# Patient Record
Sex: Female | Born: 1950 | Race: Black or African American | Hispanic: No | Smoking: Former smoker
Health system: Southern US, Community
[De-identification: ages and names within clinical notes are randomized; demographics above are authoritative.]

## PROBLEM LIST (undated history)

## (undated) DIAGNOSIS — M1990 Unspecified osteoarthritis, unspecified site: Secondary | ICD-10-CM

## (undated) DIAGNOSIS — I82409 Acute embolism and thrombosis of unspecified deep veins of unspecified lower extremity: Secondary | ICD-10-CM

## (undated) DIAGNOSIS — E876 Hypokalemia: Secondary | ICD-10-CM

## (undated) DIAGNOSIS — D649 Anemia, unspecified: Secondary | ICD-10-CM

## (undated) HISTORY — DX: Acute embolism and thrombosis of unspecified deep veins of unspecified lower extremity: I82.409

## (undated) HISTORY — PX: NO PAST SURGERIES: SHX2092

## (undated) HISTORY — DX: Hypokalemia: E87.6

## (undated) HISTORY — DX: Anemia, unspecified: D64.9

---

## 2011-06-19 DIAGNOSIS — I82409 Acute embolism and thrombosis of unspecified deep veins of unspecified lower extremity: Secondary | ICD-10-CM

## 2011-06-19 HISTORY — DX: Acute embolism and thrombosis of unspecified deep veins of unspecified lower extremity: I82.409

## 2011-07-15 ENCOUNTER — Emergency Department (HOSPITAL_COMMUNITY)
Admission: EM | Admit: 2011-07-15 | Discharge: 2011-07-15 | Disposition: A | Discharge status: Home / Self Care | Payer: Medicaid Other | Attending: Emergency Medicine | Admitting: Emergency Medicine

## 2011-07-15 ENCOUNTER — Emergency Department (HOSPITAL_COMMUNITY): Payer: Medicaid Other

## 2011-07-15 DIAGNOSIS — N390 Urinary tract infection, site not specified: Secondary | ICD-10-CM | POA: Insufficient documentation

## 2011-07-15 DIAGNOSIS — R109 Unspecified abdominal pain: Secondary | ICD-10-CM | POA: Insufficient documentation

## 2011-07-15 DIAGNOSIS — N898 Other specified noninflammatory disorders of vagina: Secondary | ICD-10-CM | POA: Insufficient documentation

## 2011-07-15 DIAGNOSIS — N949 Unspecified condition associated with female genital organs and menstrual cycle: Secondary | ICD-10-CM | POA: Insufficient documentation

## 2011-07-15 LAB — CBC
HCT: 30.8 % — ABNORMAL LOW (ref 36.0–46.0)
Hemoglobin: 9.1 g/dL — ABNORMAL LOW (ref 12.0–15.0)
MCH: 22.9 pg — ABNORMAL LOW (ref 26.0–34.0)
MCHC: 29.5 g/dL — ABNORMAL LOW (ref 30.0–36.0)
MCV: 77.4 fL — ABNORMAL LOW (ref 78.0–100.0)
Platelets: 179 10*3/uL (ref 150–400)
RBC: 3.98 MIL/uL (ref 3.87–5.11)
RDW: 17.8 % — ABNORMAL HIGH (ref 11.5–15.5)
WBC: 6.7 10*3/uL (ref 4.0–10.5)

## 2011-07-15 LAB — DIFFERENTIAL
Basophils Absolute: 0 10*3/uL (ref 0.0–0.1)
Basophils Relative: 0 % (ref 0–1)
Eosinophils Absolute: 0.2 10*3/uL (ref 0.0–0.7)
Eosinophils Relative: 2 % (ref 0–5)
Lymphocytes Relative: 32 % (ref 12–46)
Lymphs Abs: 2.1 10*3/uL (ref 0.7–4.0)
Monocytes Absolute: 0.5 10*3/uL (ref 0.1–1.0)
Monocytes Relative: 7 % (ref 3–12)
Neutro Abs: 4 10*3/uL (ref 1.7–7.7)
Neutrophils Relative %: 59 % (ref 43–77)

## 2011-07-15 LAB — BASIC METABOLIC PANEL
BUN: 13 mg/dL (ref 6–23)
CO2: 26 mEq/L (ref 19–32)
Calcium: 8.6 mg/dL (ref 8.4–10.5)
Chloride: 107 mEq/L (ref 96–112)
Creatinine, Ser: 0.71 mg/dL (ref 0.50–1.10)
GFR calc Af Amer: >60 mL/min (ref >60)
GFR calc non Af Amer: >60 mL/min (ref >60)
Glucose, Bld: 82 mg/dL (ref 70–99)
Potassium: 2.9 mEq/L — ABNORMAL LOW (ref 3.5–5.1)
Sodium: 142 mEq/L (ref 135–145)

## 2011-07-15 LAB — URINALYSIS, ROUTINE W REFLEX MICROSCOPIC
Bilirubin Urine: NEGATIVE
Glucose, UA: NEGATIVE mg/dL
Ketones, ur: 15 mg/dL — AB
Nitrite: NEGATIVE
Protein, ur: NEGATIVE mg/dL
Specific Gravity, Urine: 1.015 (ref 1.005–1.030)
Urobilinogen, UA: 0.2 mg/dL (ref 0.0–1.0)
pH: 6 (ref 5.0–8.0)

## 2011-07-15 LAB — URINE MICROSCOPIC-ADD ON

## 2011-07-30 ENCOUNTER — Inpatient Hospital Stay (HOSPITAL_COMMUNITY)
Admission: EM | Admit: 2011-07-30 | Discharge: 2011-08-06 | DRG: 299 | Disposition: A | Discharge status: Home Health | Payer: Medicaid Other | Attending: Internal Medicine | Admitting: Internal Medicine

## 2011-07-30 ENCOUNTER — Inpatient Hospital Stay (HOSPITAL_COMMUNITY)
Admission: AD | Admit: 2011-07-30 | Discharge: 2011-07-30 | Disposition: A | Discharge status: Still In Hospital | Payer: Medicaid Other | Source: Ambulatory Visit | Attending: Obstetrics and Gynecology | Admitting: Obstetrics and Gynecology

## 2011-07-30 ENCOUNTER — Encounter (HOSPITAL_COMMUNITY): Payer: Self-pay

## 2011-07-30 DIAGNOSIS — N938 Other specified abnormal uterine and vaginal bleeding: Secondary | ICD-10-CM | POA: Diagnosis present

## 2011-07-30 DIAGNOSIS — N8500 Endometrial hyperplasia, unspecified: Secondary | ICD-10-CM | POA: Diagnosis present

## 2011-07-30 DIAGNOSIS — A5901 Trichomonal vulvovaginitis: Secondary | ICD-10-CM | POA: Insufficient documentation

## 2011-07-30 DIAGNOSIS — A599 Trichomoniasis, unspecified: Secondary | ICD-10-CM

## 2011-07-30 DIAGNOSIS — N950 Postmenopausal bleeding: Secondary | ICD-10-CM | POA: Insufficient documentation

## 2011-07-30 DIAGNOSIS — I824Y9 Acute embolism and thrombosis of unspecified deep veins of unspecified proximal lower extremity: Principal | ICD-10-CM | POA: Diagnosis present

## 2011-07-30 DIAGNOSIS — E669 Obesity, unspecified: Secondary | ICD-10-CM | POA: Diagnosis present

## 2011-07-30 DIAGNOSIS — I82409 Acute embolism and thrombosis of unspecified deep veins of unspecified lower extremity: Secondary | ICD-10-CM | POA: Insufficient documentation

## 2011-07-30 DIAGNOSIS — D500 Iron deficiency anemia secondary to blood loss (chronic): Secondary | ICD-10-CM | POA: Diagnosis present

## 2011-07-30 DIAGNOSIS — I2699 Other pulmonary embolism without acute cor pulmonale: Secondary | ICD-10-CM | POA: Diagnosis present

## 2011-07-30 DIAGNOSIS — N949 Unspecified condition associated with female genital organs and menstrual cycle: Secondary | ICD-10-CM | POA: Diagnosis present

## 2011-07-30 LAB — URINALYSIS, ROUTINE W REFLEX MICROSCOPIC
Ketones, ur: 15 mg/dL — AB
Protein, ur: 30 mg/dL — AB
Urobilinogen, UA: 0.2 mg/dL (ref 0.0–1.0)

## 2011-07-30 LAB — COMPREHENSIVE METABOLIC PANEL
ALT: 6 U/L (ref 0–35)
AST: 16 U/L (ref 0–37)
CO2: 25 mEq/L (ref 19–32)
Chloride: 102 mEq/L (ref 96–112)
GFR calc non Af Amer: 54 mL/min — ABNORMAL LOW (ref >60)
Potassium: 4.3 mEq/L (ref 3.5–5.1)
Sodium: 138 mEq/L (ref 135–145)
Total Bilirubin: 0.2 mg/dL — ABNORMAL LOW (ref 0.3–1.2)

## 2011-07-30 LAB — DIFFERENTIAL
Basophils Absolute: 0 10*3/uL (ref 0.0–0.1)
Lymphocytes Relative: 21 % (ref 12–46)
Neutro Abs: 5.8 10*3/uL (ref 1.7–7.7)

## 2011-07-30 LAB — URINE MICROSCOPIC-ADD ON

## 2011-07-30 LAB — WET PREP, GENITAL

## 2011-07-30 LAB — CBC
HCT: 30.3 % — ABNORMAL LOW (ref 36.0–46.0)
Platelets: 188 10*3/uL (ref 150–400)
RDW: 18 % — ABNORMAL HIGH (ref 11.5–15.5)
WBC: 8.2 10*3/uL (ref 4.0–10.5)

## 2011-07-30 MED ORDER — METRONIDAZOLE 500 MG PO TABS
2000.0000 mg | ORAL_TABLET | Freq: Once | ORAL | Status: AC
  Administered 2011-07-30: 2000 mg via ORAL
  Filled 2011-07-30: qty 4

## 2011-07-30 MED ORDER — OXYCODONE-ACETAMINOPHEN 5-325 MG PO TABS
2.0000 | ORAL_TABLET | Freq: Once | ORAL | Status: AC
  Administered 2011-07-30: 2 via ORAL
  Filled 2011-07-30: qty 2

## 2011-07-30 NOTE — ED Provider Notes (Signed)
This is an addendum to the previously documented note by Dr. Lucina Mellow on 07/30/2011.   Procedures: - Wet Prep - [place] b/l lower extremities  Assessment and Plan 1)LLE DVT - Preliminary report states DVT noted in popliteal and common femoral veins. Started on treatment dose lovenox. Will transfer to [facility] for further care 2)Postmenopausal Vaginal Bleeding - Recent pelvic US did not identify mass concerning for malignancy, however suspicion for this is very high given bleeding from cervix noted on exam and concurrence of DVT 3)Trichomonas - s/p 2g flagyl x1.  4)Transfer to [facility] for further management.  Lutricia Feil MD  This plan was discussed in detail with Dr. Debroah Loop who agrees with the plan.

## 2011-07-30 NOTE — Progress Notes (Signed)
Pt states she has not had regular periods for over one year. Has been having abdominal pain/cramping almost every month for the entire time. Pt states she was seen at [facility] ED about 2 weeks ago and was told she has an enlarged uterus and referred to the [facility]. Her appointment is not until October and the pain will not go away. Legs swelling.

## 2011-07-31 ENCOUNTER — Emergency Department (HOSPITAL_COMMUNITY): Payer: Medicaid Other

## 2011-07-31 ENCOUNTER — Encounter (HOSPITAL_COMMUNITY): Payer: Self-pay

## 2011-07-31 LAB — DIFFERENTIAL
Eosinophils Relative: 2 % (ref 0–5)
Lymphocytes Relative: 16 % (ref 12–46)
Lymphs Abs: 1.1 10*3/uL (ref 0.7–4.0)
Monocytes Absolute: 0.3 10*3/uL (ref 0.1–1.0)
Monocytes Relative: 4 % (ref 3–12)
Neutro Abs: 5.4 10*3/uL (ref 1.7–7.7)

## 2011-07-31 LAB — GC/CHLAMYDIA PROBE AMP, GENITAL: GC Probe Amp, Genital: NEGATIVE

## 2011-07-31 LAB — CBC
HCT: 28.7 % — ABNORMAL LOW (ref 36.0–46.0)
Hemoglobin: 8.3 g/dL — ABNORMAL LOW (ref 12.0–15.0)
MCHC: 28.9 g/dL — ABNORMAL LOW (ref 30.0–36.0)
MCV: 77.6 fL — ABNORMAL LOW (ref 78.0–100.0)
RDW: 17.5 % — ABNORMAL HIGH (ref 11.5–15.5)

## 2011-07-31 LAB — HEPARIN LEVEL (UNFRACTIONATED): Heparin Unfractionated: 0.56 IU/mL (ref 0.30–0.70)

## 2011-07-31 LAB — APTT: aPTT: 75 seconds — ABNORMAL HIGH (ref 24–37)

## 2011-07-31 LAB — URINE CULTURE

## 2011-07-31 LAB — PROTIME-INR: Prothrombin Time: 16.3 seconds — ABNORMAL HIGH (ref 11.6–15.2)

## 2011-07-31 MED ORDER — IOHEXOL 350 MG/ML SOLN
100.0000 mL | Freq: Once | INTRAVENOUS | Status: AC | PRN
  Administered 2011-07-31: 100 mL via INTRAVENOUS

## 2011-08-01 LAB — CBC
HCT: 27.2 % — ABNORMAL LOW (ref 36.0–46.0)
Hemoglobin: 8 g/dL — ABNORMAL LOW (ref 12.0–15.0)
MCHC: 29.4 g/dL — ABNORMAL LOW (ref 30.0–36.0)
MCV: 76.6 fL — ABNORMAL LOW (ref 78.0–100.0)
RDW: 17.5 % — ABNORMAL HIGH (ref 11.5–15.5)

## 2011-08-01 LAB — BASIC METABOLIC PANEL
BUN: 9 mg/dL (ref 6–23)
CO2: 24 mEq/L (ref 19–32)
Calcium: 8.7 mg/dL (ref 8.4–10.5)
GFR calc non Af Amer: >60 mL/min (ref >60)
Glucose, Bld: 92 mg/dL (ref 70–99)
Potassium: 3.6 mEq/L (ref 3.5–5.1)
Sodium: 138 mEq/L (ref 135–145)

## 2011-08-02 LAB — HEMOGLOBIN AND HEMATOCRIT, BLOOD: HCT: 28.4 % — ABNORMAL LOW (ref 36.0–46.0)

## 2011-08-02 LAB — CBC
HCT: 28.3 % — ABNORMAL LOW (ref 36.0–46.0)
Hemoglobin: 8 g/dL — ABNORMAL LOW (ref 12.0–15.0)
RBC: 3.62 MIL/uL — ABNORMAL LOW (ref 3.87–5.11)

## 2011-08-02 LAB — HEPARIN LEVEL (UNFRACTIONATED)
Heparin Unfractionated: 0.28 IU/mL — ABNORMAL LOW (ref 0.30–0.70)
Heparin Unfractionated: 0.29 IU/mL — ABNORMAL LOW (ref 0.30–0.70)

## 2011-08-02 LAB — PROTIME-INR
INR: 1.09 (ref 0.00–1.49)
Prothrombin Time: 14.3 seconds (ref 11.6–15.2)

## 2011-08-03 LAB — PROTIME-INR
INR: 1.29 (ref 0.00–1.49)
Prothrombin Time: 16.3 seconds — ABNORMAL HIGH (ref 11.6–15.2)

## 2011-08-03 LAB — CBC
Platelets: 298 10*3/uL (ref 150–400)
RBC: 3.6 MIL/uL — ABNORMAL LOW (ref 3.87–5.11)
RDW: 17.6 % — ABNORMAL HIGH (ref 11.5–15.5)
WBC: 6.1 10*3/uL (ref 4.0–10.5)

## 2011-08-03 LAB — HEMOGLOBIN AND HEMATOCRIT, BLOOD
HCT: 28.5 % — ABNORMAL LOW (ref 36.0–46.0)
Hemoglobin: 8.2 g/dL — ABNORMAL LOW (ref 12.0–15.0)

## 2011-08-04 LAB — CROSSMATCH
ABO/RH(D): AB POS
Unit division: 0

## 2011-08-04 LAB — CBC
HCT: 28.2 % — ABNORMAL LOW (ref 36.0–46.0)
MCH: 22.7 pg — ABNORMAL LOW (ref 26.0–34.0)
MCHC: 29.1 g/dL — ABNORMAL LOW (ref 30.0–36.0)
MCV: 78.1 fL (ref 78.0–100.0)
RDW: 17.9 % — ABNORMAL HIGH (ref 11.5–15.5)

## 2011-08-05 LAB — HEMOGLOBIN AND HEMATOCRIT, BLOOD
HCT: 28 % — ABNORMAL LOW (ref 36.0–46.0)
Hemoglobin: 8.2 g/dL — ABNORMAL LOW (ref 12.0–15.0)
Hemoglobin: 8.2 g/dL — ABNORMAL LOW (ref 12.0–15.0)

## 2011-08-05 LAB — PROTIME-INR: INR: 1.85 — ABNORMAL HIGH (ref 0.00–1.49)

## 2011-08-06 LAB — CBC
Hemoglobin: 8 g/dL — ABNORMAL LOW (ref 12.0–15.0)
MCH: 22.4 pg — ABNORMAL LOW (ref 26.0–34.0)
Platelets: 342 10*3/uL (ref 150–400)
RBC: 3.57 MIL/uL — ABNORMAL LOW (ref 3.87–5.11)
WBC: 6.1 10*3/uL (ref 4.0–10.5)

## 2011-08-06 LAB — PROTIME-INR
INR: 2.65 — ABNORMAL HIGH (ref 0.00–1.49)
Prothrombin Time: 28.7 seconds — ABNORMAL HIGH (ref 11.6–15.2)

## 2011-08-07 NOTE — Discharge Summary (Signed)
  Amber Jordan, MCCOLLISTER NO.:  [id number]  MEDICAL RECORD NO.:  [id number]  LOCATION:  1526                         FACILITY:  [facility]  PHYSICIAN:  Talmage Nap, MD  DATE OF BIRTH:  1951-10-13  DATE OF ADMISSION:  07/30/2011 DATE OF DISCHARGE:  08/06/2011                        DISCHARGE SUMMARY - REFERRING   ADDENDUM: For discharge diagnoses as well as discharge medications, refer to the discharge summary dictated on August 04, 2011 by Dr. Peggye Pitt.  The patient was however, seen by me for the very first time in this admission today, which is August 06, 2011.  She denied any specific complaint.  No pain in the lower extremity.  No chest pain, no shortness of breath.  She denied any bleeding per vagina.  Examination of the patient was essentially unremarkable apart from +1 pedal edema. Vital signs, blood pressure is 127/71, pulse 71, respiratory rate 18, and temperature is 98.1.  Labs prior to discharge include complete blood count with no differential, which showed WBC of 6.1, hemoglobin 8.0, hematocrit of 28.2, MCV of 79.0, and platelet count of 342.  Coagulation profile showed PT 28.7 and INR 2.65.  The patient is however, medically stable.  Plan is for the patient to be discharged today and activity as tolerated.  For discharge medications, refer to the discharge summary dictated on August 04, 2011.     Talmage Nap, MD     CN/MEDQ  D:  08/06/2011  T:  08/06/2011  Job:  [id number]  Electronically Signed by Talmage Nap  on 08/07/2011 05:54:47 PM

## 2011-08-08 ENCOUNTER — Ambulatory Visit (INDEPENDENT_AMBULATORY_CARE_PROVIDER_SITE_OTHER): Payer: Self-pay | Admitting: Family

## 2011-08-08 ENCOUNTER — Encounter: Payer: Self-pay | Admitting: Family Medicine

## 2011-08-08 DIAGNOSIS — N950 Postmenopausal bleeding: Secondary | ICD-10-CM

## 2011-08-08 DIAGNOSIS — I82409 Acute embolism and thrombosis of unspecified deep veins of unspecified lower extremity: Secondary | ICD-10-CM | POA: Insufficient documentation

## 2011-08-08 MED ORDER — MISOPROSTOL 200 MCG PO TABS: ORAL_TABLET | ORAL | Status: DC

## 2011-08-08 NOTE — Progress Notes (Signed)
  Subjective:    Amber Jordan is a 60 y.o., post-menopausal female who presents for concerns regarding vaginal bleeding. She has been menopausal for 6 years. Currently on no HRT and has been on this regimen for n/a . Bleeding is described as flow about like a period and has occurred 10 times in past year. Other menopausal symptoms include: myalgias/arthralgias (moderate) and vaginal dryness (mild). Workup to date: CBC and pelvic ultrasound.  Pt currently discharged two days ago after hospitalization for DVT, placed on coumadin.    Menstrual History: OB History    Grav Para Term Preterm Abortions TAB SAB Ect Mult Living   3 1 1  0 2 2 0 0 0 1      No LMP recorded. Patient is postmenopausal.      Review of Systems Constitutional: negative Respiratory: negative Cardiovascular: negative Genitourinary:positive for vaginal bleeding Hematologic/lymphatic: positive for bleeding Musculoskeletal:positive for arthralgias    Objective:    Temp(Src) 98.9 F (37.2 C) (Oral)  Consulted with Dr. Jolayne Panther prior to procedure to ensure that it was okay to proceed with biopsy due to current anticoagulation therapy.    Patient given informed consent, signed copy in the chart, time out was performed. Appropriate time out taken. . The patient was placed in the lithotomy position and the cervix brought into view with sterile speculum.  Portio of cervix cleansed x 2 with betadine swabs.  Uterus unable to be sounded due to cervical stenosis;  Dr. Okey Dupre called to room to attempt biopsy; unsuccessful.  Bleeding in small amount.   Assessment:    Postmenopausal bleeding   Plan:  Pt given RX for Cytotec to be placed in vagina night before biopsy; return to clinic in one week.  [facility]

## 2011-08-12 ENCOUNTER — Other Ambulatory Visit: Payer: Self-pay | Admitting: Family Medicine

## 2011-08-19 ENCOUNTER — Ambulatory Visit: Payer: Self-pay | Admitting: Internal Medicine

## 2011-08-19 ENCOUNTER — Ambulatory Visit: Payer: Self-pay | Admitting: Oncology

## 2011-08-20 NOTE — H&P (Signed)
Amber Jordan, Amber Jordan NO.:  [id number]  MEDICAL RECORD NO.:  [id number]  LOCATION:  WLED                         FACILITY:  [facility]  PHYSICIAN:  Thomasenia Bottoms, MDDATE OF BIRTH:  26-Dec-1950  DATE OF ADMISSION:  07/30/2011 DATE OF DISCHARGE:                             HISTORY & PHYSICAL   PRIMARY CARE PHYSICIAN:  None.  CHIEF COMPLAINT:  Swelling of the left leg.  HISTORY OF PRESENTING ILLNESS:  Amber Jordan is a pleasant 60 year old woman who presents with increased swelling and pain of her left leg. This has been going on now for 4 days.  The patient has also been dealing with abdominal pain, which has been going on for several weeks. She went to [facility] with this and had an ultrasound, which revealed what she describes as "a swollen uterus."  Patient does not have any physicians, no primary care, or GYN doctor, but she was set up with the clinic and she has a follow up on October 5th.  She was told to come back to the emergency department if her pain became unbearable and it has definitely gotten worse, but then the leg started as well.  She denies any chest pain or shortness of breath.  She has been ambulating. She is not immobile.  PAST MEDICAL HISTORY:  Her past medical history is significant only for this "swollen uterus," which was diagnosed last month.  No other medical problems.  Medications:  She takes no medications.  SOCIAL HISTORY:  She quit smoking and quit drinking 7 years ago, when she started going back to church.  No illicit drugs.  The patient is not working.  She is on disability for depression and knee pain, but she has never had anyone evaluate her knees.  She says that the knee pain is from arthritis she believes.  FAMILY HISTORY:  No history of cancer.  REVIEW OF SYSTEMS:  The patient denies any chest pain, shortness of breath, or headache.  No fevers.  No chills.  No lower extremity edema entail the swelling of  her left leg.  She has never had anything like that before.  Her appetite has been poor the last couple of days, she feels it is probably because of the swelling and the pain and discomfort that it caused, generally speaking her appetite is quite good.  She has had a little trouble with constipation, which seems better with applesauce, which is currently what she is taking for it.  All other systems were reviewed and are negative.  PHYSICAL EXAMINATION:  VITAL SIGNS:  On arrival, her blood pressure was 139/52, pulse 94, temperature 98.4, O2 sat 95% on room air, and respiratory rate 20. GENERAL:  The patient is obese.  She is in no acute distress. HEENT:  Normocephalic, atraumatic.  Her pupils are equal and round.  The sclera nonicteric.  Oral mucosa moist. NECK:  Supple.  No lymphadenopathy.  No thyromegaly.  No jugular venous distension. CARDIAC:  Regular rate and rhythm.  No murmurs, gallops, or rubs. LUNGS:  Clear to auscultation bilaterally.  No wheezes, rhonchi, or rales. ABDOMEN:  Obese.  It is tender in the left lower quadrant and  some in the suprapubic area.  She is obese and I am not able to definitively palpate her uterus.  No hepatosplenomegaly appreciated. EXTREMITIES:  Reveal left lower extremity which is red, warm to the touch, swollen in the calf area. NEUROLOGIC:  She is alert and oriented x3.  She is attentive and appropriate.  Normal affect.  She is cooperative.  Her cranial nerves II- XII are intact grossly.  She moves each of her extremities spontaneously.  She has a normal gait.  Sensory examination is grossly intact to light touch.  Further motor examination or strength examination is deferred because of the blood clot. MUSCULOSKELETAL:  Reveals no evidence of effusion of her joints.  I did not evaluate her groin or do a GYN exam.  LABORATORY DATA:  Patient's TSH is 0.63.  Her D-dimer is elevated greater than 20.  Her sodium is 138, potassium 4.3, chloride  102, bicarb 25, glucose is 94, BUN 19, creatinine 1.0, total bili 0.2, AST 16, and ALT 16.  White count is 8.2, hemoglobin is 8.8, hematocrit is 30.3, and platelet count is 188.  Urinalysis reveals an elevated specific gravity, small blood, some protein, negative nitrite, and small leukocyte. Patient apparently had an ultrasound of her leg, which revealed DVT, but that result is not in our computer.  She did; however, have a CT scan of the chest, which reveals pulmonary embolus in both main pulmonary arteries extending distally into the segmental and subsegmental branches of the right middle lobe, right lower lobe, left upper lobe, and left lower lobe; small pulmonary infarct noted in the right lung base and multiple pulmonary nodules within both lungs measuring up to 1.3 cm in size, unclear if this is prior infection or metastatic disease to both lungs.  No mediastinal lymphadenopathy.  ASSESSMENT AND PLAN: 1. 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  Patient also has a left deep venous thrombosis.     Given the amount of clot, my first inclination would be discharge     the patient on IV heparin, but she has already received Lovenox in     the emergency department, we may transition her over until all of     her other issues are stable.  She will need Coumadin and extensive     Coumadin teaching.  She also does not have a primary care     physician, so she will need followup, but before I start the     Coumadin, we will make sure the issues related to her uterus are     taking care of and will need to work up any possible malignancy. 2. Severe anemia secondary to metromenorrhagia.  Patient also has an     abnormal uterus.  We will consult GYN as an inpatient as she may     need definitive treatment for this because of her need for     anticoagulation.  If there is any problem with anticoagulation, she     certainly may require a filter for  the short term. 3. Abnormal CT scan.  The patient will probably have a CT scan of her     abdomen and pelvis. 4. We may need guidance from GYN on this as well.     Thomasenia Bottoms, MD     CVC/MEDQ  D:  07/31/2011  T:  07/31/2011  Job:  [id number]  Electronically Signed by Buena Irish MD on 08/20/2011 06:10:20 PM

## 2011-08-21 ENCOUNTER — Encounter (HOSPITAL_COMMUNITY): Payer: Self-pay | Admitting: *Deleted

## 2011-08-21 ENCOUNTER — Observation Stay (HOSPITAL_COMMUNITY)
Admission: AD | Admit: 2011-08-21 | Discharge: 2011-08-22 | Disposition: A | Discharge status: Home / Self Care | Payer: Medicaid Other | Source: Ambulatory Visit | Attending: Obstetrics & Gynecology | Admitting: Obstetrics & Gynecology

## 2011-08-21 DIAGNOSIS — N938 Other specified abnormal uterine and vaginal bleeding: Principal | ICD-10-CM | POA: Insufficient documentation

## 2011-08-21 DIAGNOSIS — D689 Coagulation defect, unspecified: Secondary | ICD-10-CM | POA: Insufficient documentation

## 2011-08-21 DIAGNOSIS — I824Z9 Acute embolism and thrombosis of unspecified deep veins of unspecified distal lower extremity: Secondary | ICD-10-CM | POA: Insufficient documentation

## 2011-08-21 DIAGNOSIS — N950 Postmenopausal bleeding: Secondary | ICD-10-CM

## 2011-08-21 DIAGNOSIS — N949 Unspecified condition associated with female genital organs and menstrual cycle: Secondary | ICD-10-CM | POA: Insufficient documentation

## 2011-08-21 DIAGNOSIS — D649 Anemia, unspecified: Secondary | ICD-10-CM | POA: Insufficient documentation

## 2011-08-21 HISTORY — DX: Acute embolism and thrombosis of unspecified deep veins of unspecified lower extremity: I82.409

## 2011-08-21 LAB — WET PREP, GENITAL
Clue Cells Wet Prep HPF POC: NONE SEEN
Trich, Wet Prep: NONE SEEN

## 2011-08-21 LAB — CBC
Hemoglobin: 9.3 g/dL — ABNORMAL LOW (ref 12.0–15.0)
MCH: 22.9 pg — ABNORMAL LOW (ref 26.0–34.0)
MCHC: 28.5 g/dL — ABNORMAL LOW (ref 30.0–36.0)
Platelets: 346 10*3/uL (ref 150–400)
RBC: 4.07 MIL/uL (ref 3.87–5.11)

## 2011-08-21 LAB — PROTIME-INR
INR: 5.99 (ref 0.00–1.49)
Prothrombin Time: 54.3 seconds — ABNORMAL HIGH (ref 11.6–15.2)

## 2011-08-21 MED ORDER — SODIUM CHLORIDE 0.9 % IV SOLN
INTRAVENOUS | Status: DC
  Administered 2011-08-21: 12:00:00 via INTRAVENOUS

## 2011-08-21 MED ORDER — ONDANSETRON HCL 4 MG/2ML IJ SOLN
4.0000 mg | Freq: Once | INTRAMUSCULAR | Status: AC
  Administered 2011-08-21: 4 mg via INTRAVENOUS
  Filled 2011-08-21: qty 2

## 2011-08-21 MED ORDER — HYDROMORPHONE HCL 1 MG/ML IJ SOLN
0.5000 mg | Freq: Once | INTRAMUSCULAR | Status: AC
  Administered 2011-08-21: 0.5 mg via INTRAVENOUS
  Filled 2011-08-21: qty 1

## 2011-08-21 MED ORDER — LACTATED RINGERS IV SOLN: INTRAVENOUS | Status: DC

## 2011-08-21 MED ORDER — IBUPROFEN 200 MG PO TABS: 800.0000 mg | ORAL_TABLET | Freq: Four times a day (QID) | ORAL | Status: AC | PRN

## 2011-08-21 MED ORDER — OXYCODONE-ACETAMINOPHEN 5-325 MG PO TABS
1.0000 | ORAL_TABLET | ORAL | Status: DC | PRN
  Administered 2011-08-21 – 2011-08-22 (×5): 1 via ORAL
  Filled 2011-08-21 (×5): qty 1

## 2011-08-21 MED ORDER — VITAMIN K1 10 MG/ML IJ SOLN
10.0000 mg | Freq: Once | INTRAMUSCULAR | Status: AC
Start: 2011-08-21 — End: 2011-08-21
  Administered 2011-08-21: 10 mg via INTRAVENOUS
  Filled 2011-08-21: qty 1

## 2011-08-21 NOTE — ED Provider Notes (Signed)
History     Chief Complaint  Patient presents with  . Vaginal Bleeding   HPI Patient is a 60 year old female with a history of post-menopausal bleeding who presents to the MAU today complaining of heavy vaginal bleeding with diffuse lower abdominal/pelvic pain and cramping that has been persistent for the past 2 weeks and has gotten progressively worse since onset. She reports the pain is sharp, severe 10/10, and persistent. Her pain had been controlled with oxycodone, but she reports running out of her pain Rx several days ago and since then she has been taking Tylenol without relief.  She has gone through 20+ pads in the past 24 hours noting heavy bleeding with clumping. The patient was recently admitted on 07/30/11 for DVT of left lower extremity. She is currently taking 5 mg coumadin every evening at 1830. Last dose was taken last night. She is scheduled for endometrial biopsy this Friday after previous biopsy attempt failed due to stenosed cervix last week. She denies fever, chills, night sweats, but does report nausea attributed to pain with loss of appetite and 30+ lb weight loss in the past month. She denies vaginal itching, urinary symptoms, constipation, diarrhea, hematochezia, melena (although stool has been darker since starting iron supplementation), chest pain, shortness of breath, difficulty breathing, and leg pain and swelling.  Pertinent Gynecological History: Menses: post-menopausal Bleeding: dysfunctional uterine bleeding Contraception: none DES exposure: unknown Sexually transmitted diseases: recent diagnosis: Trichomonas 07/31/11      Past Medical History  Diagnosis Date  . DVT (deep venous thrombosis) 06/2011    left leg  . Anemia   . Hypokalemia   . Deep vein thrombosis (DVT)     Past Surgical History  Procedure Date  . No past surgeries     Family History  Problem Relation Age of Onset  . Alzheimer's disease Mother   . Diabetes Mother   . Heart disease  Father   . Diabetes Sister     History  Substance Use Topics  . Smoking status: Former Smoker    Quit date: 07/29/2005  . Smokeless tobacco: Not on file  . Alcohol Use: No    Allergies: No Known Allergies  Prescriptions prior to admission  Medication Sig Dispense Refill  . acetaminophen (TYLENOL) 500 MG tablet Take 1,000 mg by mouth as needed. Patient took this medication for pain.      . ferrous fumarate (HEMOCYTE - 106 MG FE) 325 (106 FE) MG TABS Take 1 tablet by mouth 2 (two) times daily.        Marland Kitchen warfarin (COUMADIN) 10 MG tablet Take 10 mg by mouth daily.        Marland Kitchen warfarin (COUMADIN) 5 MG tablet Take 5 mg by mouth daily. Pt was recently switched from 10mg  to 5mg .       . aspirin 325 MG tablet Take 325 mg by mouth daily.        . cephALEXin (KEFLEX) 500 MG capsule Take 500 mg by mouth 4 (four) times daily.        Marland Kitchen enoxaparin (LOVENOX) 150 MG/ML injection Inject 130 mg into the skin every 12 (twelve) hours.        . misoprostol (CYTOTEC) 200 MCG tablet Place 200 mcg vaginally once. Insert pills in the vagina the night before the biopsy. As of 08/21/11 pt has not yet taken.       Marland Kitchen oxyCODONE-acetaminophen (ROXICET) 5-325 MG/5ML solution Take by mouth every 4 (four) hours as needed.        Marland Kitchen  traMADol (ULTRAM) 50 MG tablet Take 50 mg by mouth every 6 (six) hours as needed.        Marland Kitchen DISCONTD: misoprostol (CYTOTEC) 200 MCG tablet Insert pills in the vagina the night before the biopsy.  2 tablet  0    Review of Systems  All other systems reviewed and are negative.   As in HPI Physical Exam   Blood pressure 156/69, pulse 106, temperature 98 F (36.7 C), temperature source Oral, resp. rate 24, height 5\' 3"  (1.6 m), weight 123.832 kg (273 lb).  Physical Exam  Nursing note and vitals reviewed. Constitutional: She is oriented to person, place, and time. She appears well-developed and well-nourished. No distress (patient in obvious discomfort).  HENT:  Head: Normocephalic and  atraumatic.  Neck: Normal range of motion. Neck supple.  Cardiovascular: Normal rate, regular rhythm and intact distal pulses.  Exam reveals no gallop and no friction rub.   Murmur heard.      Probable ejection murmur best heard over aortic region  Respiratory: Effort normal and breath sounds normal.  GI: Soft. Bowel sounds are normal. There is tenderness in the right lower quadrant, suprapubic area and left lower quadrant. There is no rebound and no guarding.  Genitourinary:       External genitalia pink, moist, w/o lesion. Large clots in the vaginal vault. Cervical motion tenderness. Bilateral adnexal tenderness. Uterus tender, difficulty with exam due to patient tenderness and habitus.  Musculoskeletal: She exhibits no edema and no tenderness.  Neurological: She is alert and oriented to person, place, and time.  Skin: Skin is warm and dry. She is not diaphoretic.  Psychiatric: She has a normal mood and affect. Her behavior is normal. Judgment and thought content normal.    MAU Course  Procedures  Results for orders placed during the hospital encounter of 08/21/11 (from the past 48 hour(s))  CBC     Status: Abnormal   Collection Time   08/21/11 10:35 AM      Component Value Range Comment   WBC 5.7  4.0 - 10.5 (K/uL)    RBC 4.07  3.87 - 5.11 (MIL/uL)    Hemoglobin 9.3 (*) 12.0 - 15.0 (g/dL)    HCT 16.1 (*) 09.6 - 46.0 (%)    MCV 80.1  78.0 - 100.0 (fL)    MCH 22.9 (*) 26.0 - 34.0 (pg)    MCHC 28.5 (*) 30.0 - 36.0 (g/dL)    RDW 04.5 (*) 40.9 - 15.5 (%)    Platelets 346  150 - 400 (K/uL)   PROTIME-INR     Status: Abnormal   Collection Time   08/21/11 10:35 AM      Component Value Range Comment   Prothrombin Time 54.3 (*) 11.6 - 15.2 (seconds) REPEATED TO VERIFY   INR 5.99 (*) 0.00 - 1.49    WET PREP, GENITAL     Status: Abnormal   Collection Time   08/21/11 11:20 AM      Component Value Range Comment   Yeast, Wet Prep NONE SEEN  NONE SEEN     Trich, Wet Prep NONE SEEN  NONE  SEEN     Clue Cells, Wet Prep NONE SEEN  NONE SEEN     WBC, Wet Prep HPF POC FEW (*) NONE SEEN  FEW BACTERIA SEEN  Consult with Dr. Marice Potter. Dr. Marice Potter came to MAU and evaluated the patient for admission.  Assessment and Plan  1. Dysfunctional uterine bleeding 2. Anemia 3. Pelvic Pain 4. Coagulopathy (INR  5.99)  Plan: 24 hour observation. IV NS. Dilaudid 0.5 mg IV. Zofran 4 mg IV. Vit K 10 mg in dextrose 5% 50 mL. Dr. Marice Potter wrote admission orders for patient.  Janeece Riggers 08/21/2011, 11:49 AM   I have examined this patient and assisted the student Janeece Riggers) with the assessment and plan of care.   [place], [place] 08/21/11 1254

## 2011-08-21 NOTE — Progress Notes (Signed)
Pt was to have uterine biopsy in GYN clinic last Friday, states "they were unable to do it because my cervix wouldn't dilate."  Pt states she is rescheduled for another bx this Friday.  Pt brought in EMS from home with C/O heavy bleeding & severe cramping that started last night, also states she has been passing numerous clots.  Was having light bleeding ever since bx attempt, but bleeding became heavy last night.

## 2011-08-21 NOTE — Progress Notes (Signed)
Pt had DVT in L leg in September - was hospitalized at [facility], on coumadin.

## 2011-08-21 NOTE — Progress Notes (Signed)
  Her bleeding is much decreased.  She has an appt on 08-23-11 in the gyn clinic for an endometrial biopsy.  She will be discharged home today and will get her PT/PTT checked tomorrow.  She will come back for any changes or worsening in her bleeding.

## 2011-08-21 NOTE — Progress Notes (Signed)
Patient currently being treated for a DVT in left leg, was on Coumadin but it is not on hold due to vaginal bleeding per MD order.

## 2011-08-22 LAB — PROTIME-INR: Prothrombin Time: 16.4 seconds — ABNORMAL HIGH (ref 11.6–15.2)

## 2011-08-22 LAB — GC/CHLAMYDIA PROBE AMP, GENITAL: Chlamydia, DNA Probe: NEGATIVE

## 2011-08-22 LAB — APTT: aPTT: 48 seconds — ABNORMAL HIGH (ref 24–37)

## 2011-08-22 MED ORDER — WARFARIN SODIUM 2.5 MG PO TABS
2.5000 mg | ORAL_TABLET | Freq: Every day | ORAL | Status: DC
  Administered 2011-08-22: 2.5 mg via ORAL
  Filled 2011-08-22: qty 1

## 2011-08-22 MED ORDER — WARFARIN SODIUM 2.5 MG PO TABS: 2.5000 mg | ORAL_TABLET | Freq: Every day | ORAL | Status: AC

## 2011-08-22 NOTE — Progress Notes (Signed)
UR Chart review completed.  

## 2011-08-22 NOTE — Progress Notes (Signed)
  Amber Jordan says her bleeding is much decreased and she is ready to go home now.  She understands to decrease her coumadin to 2.5 mg per day and she will take her cytotec tonight in preparation for her endometrial biopsy tomorrow at the gyn clinic.

## 2011-08-22 NOTE — Discharge Summary (Signed)
Physician Discharge Summary  Patient ID: Amber Jordan MRN: [id number] DOB/AGE: [birth year] 60 y.o.  Admit date: 08/21/2011 Discharge date: 08/22/2011  Admission Diagnoses: PMB, worsened due to very high INR (5.99)   Discharge Diagnoses: same  Hospital Course: She was admitted for overnight observation.  She was given IV Vitamin K and her bleeding very quickly subsided.  She preferred to spend the night as she didn't want to drive to [place] tomorrow to have her INR checked.  Discharge Exam: Blood pressure 147/84, pulse 80, temperature 98.3 F (36.8 C), temperature source Oral, resp. rate 18, height 5\' 3"  (1.6 m), weight 123.832 kg (273 lb), SpO2 95.00%. General appearance: alert Cardio: regular rate and rhythm, S1, S2 normal, no murmur, click, rub or gallop  Disposition: Home-Health Care Svc  Discharge Orders    Future Appointments: Provider: Department: Dept Phone: Center:   08/23/2011 8:45 AM Scheryl Darter, MD [facility] [phone] [facility]     Current Discharge Medication List Coumadin 2.5 mg daily    START taking these medications   Details  ibuprofen (ADVIL) 200 MG tablet Take 4 tablets (800 mg total) by mouth every 6 (six) hours as needed for pain. Qty: 60 tablet, Refills: 0      CONTINUE these medications which have NOT CHANGED   Details  acetaminophen (TYLENOL) 500 MG tablet Take 1,000 mg by mouth as needed. Patient took this medication for pain.    ferrous fumarate (HEMOCYTE - 106 MG FE) 325 (106 FE) MG TABS Take 1 tablet by mouth 2 (two) times daily.      cephALEXin (KEFLEX) 500 MG capsule Take 500 mg by mouth 4 (four) times daily.      misoprostol (CYTOTEC) 200 MCG tablet Place 200 mcg vaginally once. Insert pills in the vagina the night before the biopsy. As of 08/21/11 pt has not yet taken.       STOP taking these medications     warfarin (COUMADIN) 10 MG tablet      warfarin (COUMADIN) 5 MG tablet      aspirin 325 MG tablet      enoxaparin (LOVENOX)  150 MG/ML injection      oxyCODONE-acetaminophen (ROXICET) 5-325 MG/5ML solution      traMADol (ULTRAM) 50 MG tablet        Follow-up Information    Follow up with Allie Bossier., MD.   Contact information:   [address] [place] [address] [phone]       Follow up with [facility]. (12:00 for labwork)    Contact information:   [address] [place] [address]          Signed: Allie Bossier. 08/22/2011, 8:51 AM

## 2011-08-23 ENCOUNTER — Encounter: Payer: Self-pay | Admitting: Obstetrics & Gynecology

## 2011-08-23 ENCOUNTER — Ambulatory Visit (INDEPENDENT_AMBULATORY_CARE_PROVIDER_SITE_OTHER): Payer: Self-pay | Admitting: Obstetrics & Gynecology

## 2011-08-23 VITALS — BP 155/97 | HR 102 | Temp 99.1°F | Ht 62.0 in | Wt 278.4 lb

## 2011-08-23 DIAGNOSIS — N950 Postmenopausal bleeding: Secondary | ICD-10-CM

## 2011-08-23 NOTE — Patient Instructions (Signed)
Hysteroscopy Hysteroscopy is a procedure used for looking inside the womb (uterus). It may be done for many different reasons, including:  To evaluate abnormal bleeding, fibroid (benign, noncancerous) tumors, polyps, scar tissue (adhesions), and possibly cancer of the uterus.   To look for lumps (tumors) and other uterine growths.   To look for causes of why a woman cannot get pregnant (infertility), causes of recurrent loss of pregnancy (miscarriages), or a lost intrauterine device (IUD).   To perform a sterilization by blocking the fallopian tubes from inside the uterus.  A hysteroscopy should be done right after a menstrual period to be sure you are not pregnant. BEFORE THE PROCEDURE  Do not take aspirin or blood thinners for a week before the procedure, or as directed. It can cause bleeding.   Arrive at least 2 hrs. before the procedure to read and sign the necessary forms.   Arrange for someone to take you home after the procedure.   If you smoke, do not smoke for 2 weeks before the procedure.  LET YOUR CAREGIVER KNOW ABOUT:  Allergies.  Medicines taken, including herbs, eyedrops, over-the-counter medicines, and creams.   Use of steroids (by mouth or creams).   Previous problems with anesthetics or numbing medicines.   History of bleeding or blood problems.  History of blood clots.   Possibility of pregnancy, if this applies.   Previous surgery.   Other health problems.   RISKS AND COMPLICATIONS  Putting a hole in the uterus.  Excessive bleeding.   Infection.   Damage to the cervix.   Injury to other organs.  Allergic reaction to medicines.   Too much fluid used in the uterus for the procedure.   PROCEDURE  Your caregiver may give you medicine to relax you. He or she may also give you a medicine that numbs the area around the cervix (local anesthetic) or a medicine that makes you sleep (general anesthesia).   Sometimes, a medicine is placed in the cervix  the day before the procedure. This medicine makes the cervix have a larger opening (dilate). This makes it easier for the instrument to be inserted into the uterus.   A small instrument (hysteroscope) is inserted through the vagina into the uterus. This instrument is similar to a pencil-sized telescope with a light.   During the procedure, air or a liquid is put into the uterus, which allows the surgeon to see better.   Sometimes, tissue is gently scraped from inside the uterus. These tissue samples are sent to a specialist who looks at tissue samples (pathologist). The pathologist will give a report to your caregiver. This will help your caregiver decide if further treatment is necessary. The report will also help your caregiver decide on the best treatment if the test comes back abnormal.  AFTER THE PROCEDURE  If you had a general anesthetic, you may be groggy for a couple hours after the procedure.   If you had a local anesthetic, you will be advised to rest at the surgical center or caregiver's office until you are stable and feel ready to go home.   You may have some cramping for a couple days.   You may have bleeding, which varies from light spotting for a few days to menstrual-like bleeding for up to 3 to 7 days. This is normal.   Have someone take you home.  HOME CARE INSTRUCTIONS  Do not drive for 24 hours or as instructed.   Only take over-the-counter or prescription medicines  for pain, discomfort, or fever as directed by your caregiver.   Do not take aspirin. It can cause or aggravate bleeding.   Do not drive or drink alcohol while taking pain medicine.   You may resume your usual diet.   Do not use tampons, douche, or have sexual intercourse for 2 weeks, or as advised by your caregiver.   Rest and sleep for the first 24 to 48 hours.   Take your temperature twice a day for 4 to 5 days. Write it down. Give these temperatures to your caregiver if they are abnormal (above  98.6 F or 37.0 C).   Take medicines your caregiver has ordered as directed.   Follow your caregiver's advice regarding diet, exercise, lifting, driving, and general activities.   Take showers instead of baths for 2 weeks, or as recommended by your caregiver.   If you develop constipation:   Take a mild laxative with the advice of your caregiver.   Eat bran foods.   Drink enough water and fluids to keep your urine clear or pale yellow.   Try to have someone with you or available to you for the first 24 to 48 hours, especially if you had a general anesthetic.   Make sure you and your family understand everything about your operation and recovery.   Follow your caregiver's advice regarding follow-up appointments and Pap smears.  FINDING OUT THE RESULTS OF YOUR TEST Not all test results are available during your visit. If your test results are not back during the visit, make an appointment with your caregiver to find out the results. Do not assume everything is normal if you have not heard from your caregiver or the medical facility. It is important for you to follow up on all of your test results. SEEK MEDICAL CARE IF:  You feel dizzy or lightheaded.   You feel sick to your stomach (nauseous).   You develop abnormal vaginal discharge.   You develop a rash.   You have an abnormal reaction or allergy to your medicine.   You need stronger pain medicine.  SEEK IMMEDIATE MEDICAL CARE IF:  Bleeding is heavier than a normal menstrual period or you have blood clots.   You have an oral temperature above 100.5, not controlled by medicine.   You have increasing cramps or pains not relieved with medicine.   You develop belly (abdominal) pain that does not seem to be related to the same area of earlier cramping and pain.   You pass out.   You develop pain in the tops of your shoulders (shoulder strap areas).   You develop shortness of breath.  MAKE SURE YOU:  Understand these  instructions.   Will watch your condition.   Will get help right away if you are not doing well or get worse.  Document Released: 02/10/2001 Document Re-Released: 01/29/2010 [facility] Patient Information 2011 [place], [place].

## 2011-08-23 NOTE — Progress Notes (Signed)
Addended by: Stoney Bang H on: 08/23/2011 12:00 PM   Modules accepted: Orders

## 2011-08-23 NOTE — Progress Notes (Signed)
  Subjective:    Patient ID: Amber Jordan, female    DOB: [birth year], 60 y.o.   MRN: [id number]  HPI [id number] with PMB for last 2 years now more constant, recently admitted with bleeding, failed to obtain EMBx, returns for EMB after vaginal cytotec.Still some bleeding. Consents to bx.  Past Medical History  Diagnosis Date  . DVT (deep venous thrombosis) 06/2011    left leg  . Anemia   . Hypokalemia   . Deep vein thrombosis (DVT)    Current Outpatient Prescriptions on File Prior to Visit  Medication Sig Dispense Refill  . acetaminophen (TYLENOL) 500 MG tablet Take 1,000 mg by mouth as needed. Patient took this medication for pain.      . ferrous fumarate (HEMOCYTE - 106 MG FE) 325 (106 FE) MG TABS Take 1 tablet by mouth 2 (two) times daily.        Marland Kitchen warfarin (COUMADIN) 2.5 MG tablet Take 1 tablet (2.5 mg total) by mouth daily.  30 tablet  3  . cephALEXin (KEFLEX) 500 MG capsule Take 500 mg by mouth 4 (four) times daily.        Marland Kitchen ibuprofen (ADVIL) 200 MG tablet Take 4 tablets (800 mg total) by mouth every 6 (six) hours as needed for pain.  60 tablet  0  . misoprostol (CYTOTEC) 200 MCG tablet Place 200 mcg vaginally once. Insert pills in the vagina the night before the biopsy. As of 08/21/11 pt has not yet taken.        Current Facility-Administered Medications on File Prior to Visit  Medication Dose Route Frequency Provider Last Rate Last Dose  . DISCONTD: oxyCODONE-acetaminophen (PERCOCET) 5-325 MG per tablet 1 tablet  1 tablet Oral Q4H PRN Myra C. Marice Potter, MD   1 tablet at 08/22/11 1016  . DISCONTD: warfarin (COUMADIN) tablet 2.5 mg  2.5 mg Oral Daily Myra C. Marice Potter, MD   2.5 mg at 08/22/11 1015   No Known Allergies Past Surgical History  Procedure Date  . No past surgeries    Family History  Problem Relation Age of Onset  . Alzheimer's disease Mother   . Diabetes Mother   . Heart disease Father   . Diabetes Sister       Review of Systems Bleeding per vagina, no pain       Objective:   Physical Exam Filed Vitals:   08/23/11 0903  Height: 5\' 2"  (1.575 m)  Weight: 278 lb 6.4 oz (126.281 kg)   155/97 NAD pleasant Pelvic  Sl pink blood, pap done, cannot pass dilator or biopsy sampler, painful      Assessment & Plan:  PMB needs EMBx Schedule hysteroscopy, d and c. Procedure and risks discussed, questions answered

## 2011-08-25 NOTE — Discharge Summary (Signed)
NAMEGERLEAN, CID NO.:  [id number]  MEDICAL RECORD NO.:  [id number]  LOCATION:  1445                         FACILITY:  [facility]  PHYSICIAN:  Peggye Pitt, M.D. DATE OF BIRTH:  01-29-1951  DATE OF ADMISSION:  07/30/2011 DATE OF DISCHARGE:  08/04/2011                              DISCHARGE SUMMARY   Patient's new primary care physician will be the [facility].  OBSTETRICIAN/GYNECOLOGIST:  Lucina Mellow, DO  DISCHARGE DIAGNOSES: 1. Left lower extremity deep vein thrombosis and bilateral pulmonary     embolus. 2. Severe anemia secondary to dysfunctional uterine bleeding. 3. Dysfunctional uterine bleeding.  DISCHARGE MEDICATIONS: 1. Lovenox 130 mg injected subcutaneously twice daily for at least 1     more days or until INR has been therapeutic for 2 days. 2. Ferrous sulfate 325 mg twice daily. 3. Ibuprofen 600 mg 3 times a day as needed for pain. 4. Oxycodone 5 mg every 4 hours as needed for pain. 5. Trazodone 50 mg at bedtime as needed. 6. Warfarin 10 mg daily, dose to be adjusted as per INRs. 7. Aspirin 325 mg every 6 hours as needed for pain. 8. Extra Strength Tylenol 500 mg 1-2 tablets every 6 hours as needed     for pain.  DISPOSITION AND FOLLOWUP:  [name] will be discharged home today in stable and improved condition.  She will have home health draw her PT/INRs and fax the results over to the [facility] for further adjusting of her Coumadin and Lovenox dosing.  She also has an appointment scheduled for September 20th with Dr. Natale Milch at 3:25 p.m. with the GYN clinic for an endometrial biopsy.  CONSULTATION THIS HOSPITALIZATION:  None.  IMAGES AND PROCEDURES:  A CT angiogram of the chest on September 12th that showed pulmonary embolus noted within both main pulmonary arteries, extending distally into segmental and subsegmental branches of the right middle lobe, right lower lobe, left upper lobe, and left lower  lobe. 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  With multiple pulmonary nodules identified within both lungs, measuring up to 1.3 cm in size.  These may reflect either prior infection or metastatic disease to the lungs.  HISTORY AND PHYSICAL:  For full details, please refer to dictation on September 12th by Dr. Gasper Sells, but in brief [name] is a pleasant, 60 year old, obese, African American woman who presented to the hospital with complaints of left leg edema.  She has no prior medical history. She has also been having abdominal pain and menometrorrhagia which has been going on for several weeks.  Apparently, had an ultrasound that showed a "swollen uterus."  In the ED, she was found to have a left lower extremity DVT as well as bilateral PE and we are asked to admit her for further evaluation and management.  HOSPITAL COURSE BY PROBLEM: 1. Left lower extremity DVT and bilateral PE.  She is now on Lovenox     and Coumadin.  Her INR on day of discharge is 1.63.  Goal INR for     this patient is between 2 to 3.  She will need to continue her  Lovenox injections for at least 1 more day (as today is day #4 out     of 5 of mandatory overlap) or until her INR has been therapeutic     for at least 2 days.  Case management has arranged for home health     RN to come out and draw the INR and call in the results to [facility] for further Lovenox and Coumadin adjustment. 2. Dysfunctional uterine bleeding.  She has a followup appointment     with OB/GYN on September 20th.  At that time, they were planning on     doing an endometrial biopsy.  In the lungs, there are multiple     pulmonary nodules that raise concern for possible metastatic     endometrial cancer.  Rest of treatment as per Gynecology.  VITAL SIGNS:  On day of discharge, blood pressure 128/63, heart rate 65, respirations 18, saturations 98% on room air, and  temperature of 97.9.     Peggye Pitt, M.D.     EH/MEDQ  D:  08/04/2011  T:  08/04/2011  Job:  [id number]  cc:   [facility]  Lucina Mellow, DO  Electronically Signed by Peggye Pitt M.D. on 08/25/2011 07:59:36 AM

## 2011-08-28 ENCOUNTER — Encounter (HOSPITAL_COMMUNITY): Payer: Self-pay

## 2011-08-28 ENCOUNTER — Encounter (HOSPITAL_COMMUNITY)
Admission: RE | Admit: 2011-08-28 | Discharge: 2011-08-28 | Disposition: A | Discharge status: Home / Self Care | Payer: Medicaid Other | Source: Ambulatory Visit | Attending: Obstetrics & Gynecology | Admitting: Obstetrics & Gynecology

## 2011-08-28 HISTORY — DX: Unspecified osteoarthritis, unspecified site: M19.90

## 2011-08-28 LAB — CBC
HCT: 34.3 % — ABNORMAL LOW (ref 36.0–46.0)
Platelets: 198 10*3/uL (ref 150–400)
RDW: 18.9 % — ABNORMAL HIGH (ref 11.5–15.5)
WBC: 6.7 10*3/uL (ref 4.0–10.5)

## 2011-08-28 LAB — PROTIME-INR: Prothrombin Time: 17.8 seconds — ABNORMAL HIGH (ref 11.6–15.2)

## 2011-08-28 NOTE — Patient Instructions (Signed)
   Your procedure is scheduled ZO:[id number]  Enter through the Main Entrance of [facility] at:1130 am Pick up the phone at the desk and dial [phone] and inform us of your arrival.  Please call this number if you have any problems the morning of surgery: [phone]  Remember: Do not eat food after midnight:tonight Do not drink clear liquids after:9am Thursday Take these medicines the morning of surgery with a SIP OF WATER:pain meds  Do not wear jewelry, make-up, or FINGER nail polish Do not wear lotions, powders, or perfumes.  You may wear deodorant. Do not shave 48 hours prior to surgery. Do not bring valuables to the hospital.  Leave suitcase in the car. After Surgery it may be brought to your room. For patients being admitted to the hospital, checkout time is 11:00am the day of discharge.  Patients discharged on the day of surgery will not be allowed to drive home.   Name and phone number of your driver:Michael- [phone]  Remember to use your hibiclens as instructed.Please shower with 1/2 bottle the evening before your surgery and the other 1/2 bottle the morning of surgery.

## 2011-08-28 NOTE — Pre-Procedure Instructions (Signed)
I notified Dr. Cristela Blue of patient's taking Coumadin since Sept 2012 for DVT.  Dr. Debroah Loop unavailable in his office and [facility] clinic so Dr. Jean Rosenthal notified and he is ok with pt continuing to take her Coumadin.

## 2011-08-29 ENCOUNTER — Ambulatory Visit (HOSPITAL_COMMUNITY): Payer: Medicaid Other | Admitting: Anesthesiology

## 2011-08-29 ENCOUNTER — Encounter (HOSPITAL_COMMUNITY): Payer: Self-pay | Admitting: Anesthesiology

## 2011-08-29 ENCOUNTER — Encounter (HOSPITAL_COMMUNITY)
Admission: RE | Disposition: A | Discharge status: Home / Self Care | Payer: Self-pay | Source: Ambulatory Visit | Attending: Obstetrics & Gynecology

## 2011-08-29 ENCOUNTER — Other Ambulatory Visit: Payer: Self-pay | Admitting: Obstetrics & Gynecology

## 2011-08-29 ENCOUNTER — Ambulatory Visit (HOSPITAL_COMMUNITY)
Admission: RE | Admit: 2011-08-29 | Discharge: 2011-08-29 | Disposition: A | Discharge status: Home / Self Care | Payer: Medicaid Other | Source: Ambulatory Visit | Attending: Obstetrics & Gynecology | Admitting: Obstetrics & Gynecology

## 2011-08-29 DIAGNOSIS — N9489 Other specified conditions associated with female genital organs and menstrual cycle: Secondary | ICD-10-CM | POA: Insufficient documentation

## 2011-08-29 DIAGNOSIS — N840 Polyp of corpus uteri: Secondary | ICD-10-CM | POA: Insufficient documentation

## 2011-08-29 DIAGNOSIS — N950 Postmenopausal bleeding: Secondary | ICD-10-CM

## 2011-08-29 DIAGNOSIS — Z01812 Encounter for preprocedural laboratory examination: Secondary | ICD-10-CM | POA: Insufficient documentation

## 2011-08-29 DIAGNOSIS — Z01818 Encounter for other preprocedural examination: Secondary | ICD-10-CM | POA: Insufficient documentation

## 2011-08-29 HISTORY — PX: HYSTEROSCOPY WITH D & C: SHX1775

## 2011-08-29 SURGERY — DILATATION AND CURETTAGE /HYSTEROSCOPY
Anesthesia: Choice

## 2011-08-29 MED ORDER — HYDROCODONE-ACETAMINOPHEN 5-500 MG PO TABS: 1.0000 | ORAL_TABLET | ORAL | Status: AC | PRN

## 2011-08-29 MED ORDER — FENTANYL CITRATE 0.05 MG/ML IJ SOLN
INTRAMUSCULAR | Status: AC
  Filled 2011-08-29: qty 2

## 2011-08-29 MED ORDER — DEXAMETHASONE SODIUM PHOSPHATE 10 MG/ML IJ SOLN
INTRAMUSCULAR | Status: AC
  Filled 2011-08-29: qty 1

## 2011-08-29 MED ORDER — FENTANYL CITRATE 0.05 MG/ML IJ SOLN
50.0000 ug | INTRAMUSCULAR | Status: DC | PRN
  Administered 2011-08-29: 50 ug via INTRAVENOUS

## 2011-08-29 MED ORDER — ONDANSETRON HCL 4 MG/2ML IJ SOLN
INTRAMUSCULAR | Status: AC
  Filled 2011-08-29: qty 2

## 2011-08-29 MED ORDER — PROPOFOL 10 MG/ML IV EMUL
INTRAVENOUS | Status: DC | PRN
  Administered 2011-08-29: 120 ug/kg/min via INTRAVENOUS

## 2011-08-29 MED ORDER — FENTANYL CITRATE 0.05 MG/ML IJ SOLN
INTRAMUSCULAR | Status: DC | PRN
  Administered 2011-08-29: 100 ug via INTRAVENOUS
  Administered 2011-08-29: 50 ug via INTRAVENOUS
  Administered 2011-08-29: 25 ug via INTRAVENOUS
  Administered 2011-08-29 (×2): 50 ug via INTRAVENOUS

## 2011-08-29 MED ORDER — BUPIVACAINE-EPINEPHRINE 0.5% -1:200000 IJ SOLN
INTRAMUSCULAR | Status: DC | PRN
  Administered 2011-08-29: 10 mL

## 2011-08-29 MED ORDER — PROPOFOL 10 MG/ML IV EMUL
INTRAVENOUS | Status: DC | PRN
  Administered 2011-08-29 (×5): 20 mg via INTRAVENOUS
  Administered 2011-08-29: 30 mg via INTRAVENOUS

## 2011-08-29 MED ORDER — LACTATED RINGERS IV SOLN
INTRAVENOUS | Status: DC
  Administered 2011-08-29 (×4): via INTRAVENOUS

## 2011-08-29 MED ORDER — ONDANSETRON HCL 4 MG/2ML IJ SOLN
INTRAMUSCULAR | Status: DC | PRN
  Administered 2011-08-29: 4 mg via INTRAVENOUS

## 2011-08-29 MED ORDER — MIDAZOLAM HCL 5 MG/5ML IJ SOLN
INTRAMUSCULAR | Status: DC | PRN
  Administered 2011-08-29: 2 mg via INTRAVENOUS

## 2011-08-29 MED ORDER — FENTANYL CITRATE 0.05 MG/ML IJ SOLN
INTRAMUSCULAR | Status: AC
  Administered 2011-08-29: 50 ug via INTRAVENOUS
  Filled 2011-08-29: qty 2

## 2011-08-29 MED ORDER — FENTANYL CITRATE 0.05 MG/ML IJ SOLN
INTRAMUSCULAR | Status: AC
  Filled 2011-08-29: qty 5

## 2011-08-29 MED ORDER — DEXAMETHASONE SODIUM PHOSPHATE 4 MG/ML IJ SOLN
INTRAMUSCULAR | Status: DC | PRN
  Administered 2011-08-29: 4 mg via INTRAVENOUS

## 2011-08-29 MED ORDER — LIDOCAINE HCL (CARDIAC) 20 MG/ML IV SOLN
INTRAVENOUS | Status: AC
  Filled 2011-08-29: qty 5

## 2011-08-29 MED ORDER — LIDOCAINE HCL (CARDIAC) 20 MG/ML IV SOLN
INTRAVENOUS | Status: DC | PRN
  Administered 2011-08-29: 20 mg via INTRAVENOUS

## 2011-08-29 MED ORDER — ACETAMINOPHEN 10 MG/ML IV SOLN
1000.0000 mg | Freq: Once | INTRAVENOUS | Status: AC
  Administered 2011-08-29: 1000 mg via INTRAVENOUS
  Filled 2011-08-29: qty 100

## 2011-08-29 MED ORDER — PROPOFOL 10 MG/ML IV EMUL
INTRAVENOUS | Status: AC
  Filled 2011-08-29: qty 20

## 2011-08-29 MED ORDER — FENTANYL CITRATE 0.05 MG/ML IJ SOLN
25.0000 ug | INTRAMUSCULAR | Status: DC | PRN
  Administered 2011-08-29 (×5): 50 ug via INTRAVENOUS

## 2011-08-29 MED ORDER — MIDAZOLAM HCL 2 MG/2ML IJ SOLN
INTRAMUSCULAR | Status: AC
  Filled 2011-08-29: qty 2

## 2011-08-29 MED ORDER — KETOROLAC TROMETHAMINE 30 MG/ML IJ SOLN
INTRAMUSCULAR | Status: AC
  Filled 2011-08-29: qty 1

## 2011-08-29 SURGICAL SUPPLY — 19 items
ABLATOR ENDOMETRIAL BIPOLAR (ABLATOR) IMPLANT
CANISTER SUCTION 2500CC (MISCELLANEOUS) ×2 IMPLANT
CATH ROBINSON RED A/P 16FR (CATHETERS) ×2 IMPLANT
CLOTH BEACON ORANGE TIMEOUT ST (SAFETY) ×2 IMPLANT
CONTAINER PREFILL 10% NBF 60ML (FORM) ×4 IMPLANT
CORD ACTIVE DISPOSABLE (ELECTRODE)
CORD ELECTRO ACTIVE DISP (ELECTRODE) IMPLANT
DRAPE UTILITY XL STRL (DRAPES) ×2 IMPLANT
ELECT LOOP GYNE PRO 24FR (CUTTING LOOP)
ELECT REM PT RETURN 9FT ADLT (ELECTROSURGICAL) ×2
ELECTRODE LOOP GYNE PRO 24FR (CUTTING LOOP) IMPLANT
ELECTRODE REM PT RTRN 9FT ADLT (ELECTROSURGICAL) ×1 IMPLANT
GLOVE BIO SURGEON STRL SZ 6.5 (GLOVE) ×2 IMPLANT
GLOVE BIOGEL PI IND STRL 7.0 (GLOVE) ×2 IMPLANT
GLOVE BIOGEL PI INDICATOR 7.0 (GLOVE) ×2
GOWN PREVENTION PLUS LG XLONG (DISPOSABLE) ×4 IMPLANT
PACK HYSTEROSCOPY LF (CUSTOM PROCEDURE TRAY) ×2 IMPLANT
TOWEL OR 17X24 6PK STRL BLUE (TOWEL DISPOSABLE) ×4 IMPLANT
WATER STERILE IRR 1000ML POUR (IV SOLUTION) ×2 IMPLANT

## 2011-08-29 NOTE — Op Note (Signed)
Amber Jordan  08/29/2011  Procedure: Hysteroscopy and dilation and curettage Preop diagnosis: Postmenopausal bleeding Postdiagnosis: Postmenopausal bleeding and right adnexal mass Surgeon: Dr. Scheryl Darter Anesthesia: MAC and local Estimated blood loss: Less than 50 mL Specimen: Endometrial curettings Findings: Benign-appearing endometrial tissue and polyps, firm right adnexal mass 6-8 cm Complications: None Drains: None Counts: Correct  The patient gave written consent for hysteroscopy and dilation and curettage due to postmenopausal bleeding. Patient had an ultrasound that showed a normal-size uterus and a 7 mm endometrial stripe. On ultrasound the ovaries were not visualized and no masses were seen. There was an attempt to obtain endometrial curettings in the office but the cervix was stenotic. Patient identification was confirmed and she was brought to the operating room. Anesthesia under MAC was obtained. She was placed in dorsal lithotomy position. Perineum and vagina were sterilely prepped and draped. The bladder was drained with a red rubber catheter. Enervation anesthesia revealed normal-size anteverted uterus. There appeared to be a firm fixed mass in the right side of the pelvis that measured about 6-8 cm. The uterus was not fixed to this process. Speculum was inserted. Half percent Marcaine with 1 200,000 epinephrine was infiltrated for an intracervical block. The cervix was grasped with a single-tooth tenaculum. The cervix was dilated sufficiently to pass the diagnostic hysteroscope. Glycine solution was used for the distending medium. Videocamera was in use. The endometrial cavity was visualized. There was prolific endometrial tissue and what appeared to be polyps. Both tubal ostia were seen. No dominant masses were seen. The hysteroscope was removed. Endometrial curettings were obtained and sent to pathology. The uterus sounded to 10 cm. All instruments were removed. The patient  tolerated the procedure well without complications. She was brought in stable condition to the recovery room.  [name],[name] 08/29/2011

## 2011-08-29 NOTE — Anesthesia Preprocedure Evaluation (Addendum)
Anesthesia Evaluation  Name, MR# and DOB Patient awake  General Assessment Comment  Reviewed: Allergy & Precautions, H&P , NPO status , Patient's Chart, lab work & pertinent test results, reviewed documented beta blocker date and time   History of Anesthesia Complications Negative for: history of anesthetic complications  Airway Mallampati: III TM Distance: >3 FB Neck ROM: full    Dental  (+) Edentulous Upper and Edentulous Lower   Pulmonary former smoker (quit 6 yrs ago) clear to auscultation        Cardiovascular Exercise Tolerance: Good regular Tachycardia    Neuro/Psych Negative Neurological ROS  Negative Psych ROS   GI/Hepatic Neg liver ROS  GERD (no meds)   Endo/Other  Morbid obesity  Renal/GU negative Renal ROS  Genitourinary negative   Musculoskeletal   Abdominal   Peds  Hematology  (+) anemia , Hospitalized last month at [facility] for DVT/PE.  Now on coumadin.   Anesthesia Other Findings   Reproductive/Obstetrics negative OB ROS                          Anesthesia Physical Anesthesia Plan  ASA: III  Anesthesia Plan: MAC   Post-op Pain Management:    Induction:   Airway Management Planned:   Additional Equipment:   Intra-op Plan:   Post-operative Plan:   Informed Consent: I have reviewed the patients History and Physical, chart, labs and discussed the procedure including the risks, benefits and alternatives for the proposed anesthesia with the patient or authorized representative who has indicated his/her understanding and acceptance.   Dental Advisory Given  Plan Discussed with: CRNA and Surgeon  Anesthesia Plan Comments:         Anesthesia Quick Evaluation

## 2011-08-29 NOTE — Preoperative (Signed)
Beta Blockers   Reason not to administer Beta Blockers:Not Applicable 

## 2011-08-29 NOTE — Anesthesia Postprocedure Evaluation (Signed)
  Anesthesia Post-op Note  Patient: Amber Jordan  Procedure(s) Performed:  DILATATION AND CURETTAGE (D&C) /HYSTEROSCOPY  Patient is awake and responsive. Pain and nausea are controlled, and patient has script for pain meds. She has needed IV fentanyl to control pain, and when pain is a 7, patient is seen to be eating and chatting with no outward signs of pain. Patinet confirms low tolerance to pain. Vital signs are stable and clinically acceptable. Oxygen saturation is clinically acceptable. There are no apparent anesthetic complications at this time. Patient is ready for discharge. Family to be present through the night.

## 2011-08-29 NOTE — H&P (Addendum)
Subjective:   Patient ID: Amber Jordan, female DOB: 17-Apr-1951, 60 y.o. MRN: [id number]  HPI [id number] with PMB for last 2 years now more constant, recently admitted with bleeding, failed to obtain EMBx,returns today for hysteroscopy and dilation and curettage. We were unable to obtain endometrial biopsy when attempted in Gyn clinic on 2 occasions. She consents to the procedure, and states she understands the risks of anesthesia, bleeding, uterine damage, infection.  Past Medical History   Diagnosis  Date   .  DVT (deep venous thrombosis)  06/2011     left leg   .  Anemia    .  Hypokalemia    .  Deep vein thrombosis (DVT)     Current Outpatient Prescriptions on File Prior to Visit   Medication  Sig  Dispense  Refill   .  acetaminophen (TYLENOL) 500 MG tablet  Take 1,000 mg by mouth as needed. Patient took this medication for pain.     .  ferrous fumarate (HEMOCYTE - 106 MG FE) 325 (106 FE) MG TABS  Take 1 tablet by mouth 2 (two) times daily.     Marland Kitchen  warfarin (COUMADIN) 2.5 MG tablet  Take 1 tablet (2.5 mg total) by mouth daily.  30 tablet  3   .  cephALEXin (KEFLEX) 500 MG capsule  Take 500 mg by mouth 4 (four) times daily.     Marland Kitchen  ibuprofen (ADVIL) 200 MG tablet  Take 4 tablets (800 mg total) by mouth every 6 (six) hours as needed for pain.  60 tablet  0   .  misoprostol (CYTOTEC) 200 MCG tablet  Place 200 mcg vaginally once. Insert pills in the vagina the night before the biopsy. As of 08/21/11 pt has not yet taken.      Current Facility-Administered Medications on File Prior to Visit   Medication  Dose  Route  Frequency  Provider  Last Rate  Last Dose   .  DISCONTD: oxyCODONE-acetaminophen (PERCOCET) 5-325 MG per tablet 1 tablet  1 tablet  Oral  Q4H PRN  Myra C. Marice Potter, MD   1 tablet at 08/22/11 1016   .  DISCONTD: warfarin (COUMADIN) tablet 2.5 mg  2.5 mg  Oral  Daily  Myra C. Marice Potter, MD   2.5 mg at 08/22/11 1015    No Known Allergies  Past Surgical History   Procedure  Date   .  No past  surgeries     Family History   Problem  Relation  Age of Onset   .  Alzheimer's disease  Mother    .  Diabetes  Mother    .  Heart disease  Father    .  Diabetes  Sister     Review of Systems Bleeding per vagina, no pain   Objective:   Physical Exam  Filed Vitals:    08/23/11 0903   Height:  5\' 2"  (1.575 m)   Weight:  278 lb 6.4 oz (126.281 kg)    Filed Vitals:   08/29/11 1217  BP: 157/93  Pulse: 111  Temp: 98.6 F (37 C)  TempSrc: Oral  Resp: 18  SpO2: 99%    NAD pleasant  Chest clear Cor RRR Abd obese nontender Pelvic Sl pink blood, pap done, cannot pass dilator or biopsy sampler, painful   *RADIOLOGY REPORT*  Clinical Data: 60 year old female reports passing blood clots  vaginally each month. She is unsure by her menopausal status, and  reports she does not have a gynecologist.  TRANSABDOMINAL  AND TRANSVAGINAL ULTRASOUND OF PELVIS  Technique: Both transabdominal and transvaginal ultrasound  examinations of the pelvis were performed. Transabdominal technique  was performed for global imaging of the pelvis including uterus,  ovaries, adnexal regions, and pelvic cul-de-sac.  Comparison: None.  It was necessary to proceed with endovaginal exam following the  transabdominal exam to visualize the the uterus, endometrium, and  adnexa.  Findings:  Uterus: Moving measures 9.4 x 6.4 x 6.9 cm. Uterine myometrium is  heterogeneous, but no focal uterine mass is identified. The  junctional zone between the myometrium and endometrium is not very  well defined.  Endometrium: Where imaged, the endometrium is measured to be 7 mm.  Right ovary: A right ovary is not visualized. No right adnexal  mass is seen.  Left ovary: A left ovary is not visualized. No adnexal mass is  seen.  Other findings: No free pelvic fluid.  IMPRESSION:  1.The endometrium, where visualized is 7 mm in maximum thickness.  This is within normal limits if the patient is premenopausal. It  is slightly  above normal limits for postmenopausal patient (upper  normal is 5 mm). Given the patient age of 70, consider follow-up  with gynecology.  2. Heterogeneous uterine echotexture with ill-defined junctional  zone raises the possibility of adenomyosis.  3. Nonvisualization of the ovaries. No adnexal mass is seen.  Original Report Authenticated By: Britta Mccreedy, M.D.   CBC    Component Value Date/Time   WBC 6.7 08/28/2011 1412   RBC 4.27 08/28/2011 1412   HGB 9.9* 08/28/2011 1412   HCT 34.3* 08/28/2011 1412   PLT 198 08/28/2011 1412   MCV 80.3 08/28/2011 1412   MCH 23.2* 08/28/2011 1412   MCHC 28.9* 08/28/2011 1412   RDW 18.9* 08/28/2011 1412   LYMPHSABS 1.1 07/31/2011 1459   MONOABS 0.3 07/31/2011 1459   EOSABS 0.1 07/31/2011 1459   BASOSABS 0.0 07/31/2011 1459       Nl pap on 08/23/11 Assessment & Plan:   PMB needs EMBx  Scheduled for hysteroscopy, d and c. Procedure and risks discussed, questions answered. Ethne Jeon 08/29/2011

## 2011-08-29 NOTE — Progress Notes (Signed)
Patient c/o pain 10/10.  Appears uncomfortable.  Dr. Cristela Blue called to discuss plan.  Order received to give additional Fentanyl IV up to 2 cc total.

## 2011-08-29 NOTE — Transfer of Care (Signed)
Immediate Anesthesia Transfer of Care Note  Patient: Amber Jordan  Procedure(s) Performed:  DILATATION AND CURETTAGE (D&C) /HYSTEROSCOPY  Patient Location: PACU  Anesthesia Type: MAC  Level of Consciousness: awake, alert , oriented and patient cooperative  Airway & Oxygen Therapy: Patient Spontanous Breathing and Patient connected to nasal cannula oxygen  Post-op Assessment: Report given to PACU RN and Post -op Vital signs reviewed and stable  Post vital signs: Reviewed and stable  Complications: No apparent anesthesia complications

## 2011-08-29 NOTE — Progress Notes (Signed)
Patient's son sitting with her in phase II PACU.  D/C instructions reviewed.  Patient's son states that he will be able to spend the night with his mother tonight.  Importance of overnight caregiver stressed several times.  Son assured me that he or patient's sister would be staying with her until morning.

## 2011-09-01 ENCOUNTER — Encounter (HOSPITAL_COMMUNITY): Payer: Self-pay | Admitting: Obstetrics & Gynecology

## 2011-09-02 ENCOUNTER — Inpatient Hospital Stay: Payer: Self-pay | Admitting: Internal Medicine

## 2011-09-03 LAB — CA 125: CA 125: 471.5 U/mL — ABNORMAL HIGH (ref 0.0–34.0)

## 2011-09-12 LAB — PATHOLOGY REPORT

## 2011-09-13 ENCOUNTER — Ambulatory Visit: Payer: Self-pay | Admitting: Oncology

## 2011-09-19 ENCOUNTER — Ambulatory Visit: Payer: Self-pay | Admitting: Internal Medicine

## 2011-09-19 ENCOUNTER — Ambulatory Visit: Payer: Self-pay | Admitting: Oncology

## 2011-09-24 ENCOUNTER — Emergency Department: Payer: Self-pay | Admitting: Emergency Medicine

## 2011-09-25 ENCOUNTER — Ambulatory Visit: Payer: Self-pay | Admitting: Oncology

## 2011-10-02 LAB — CA 125: CA 125: 506.2 U/mL — ABNORMAL HIGH (ref 0.0–34.0)

## 2011-10-07 ENCOUNTER — Ambulatory Visit: Payer: Self-pay | Admitting: Vascular Surgery

## 2011-10-15 ENCOUNTER — Inpatient Hospital Stay: Payer: Self-pay | Admitting: Oncology

## 2011-10-19 ENCOUNTER — Ambulatory Visit: Payer: Self-pay | Admitting: Internal Medicine

## 2011-10-19 ENCOUNTER — Ambulatory Visit: Payer: Self-pay | Admitting: Oncology

## 2011-10-25 ENCOUNTER — Ambulatory Visit: Payer: Self-pay | Admitting: Oncology

## 2011-11-19 ENCOUNTER — Ambulatory Visit: Payer: Self-pay | Admitting: Oncology

## 2011-11-19 ENCOUNTER — Ambulatory Visit: Payer: Self-pay | Admitting: Internal Medicine

## 2011-11-28 LAB — CBC CANCER CENTER
Basophil #: 0 x10 3/mm (ref 0.0–0.1)
Basophil %: 0 %
Eosinophil %: 0.2 %
HCT: 27.8 % — ABNORMAL LOW (ref 35.0–47.0)
HGB: 9.1 g/dL — ABNORMAL LOW (ref 12.0–16.0)
Lymphocyte %: 15.5 %
MCH: 26.9 pg (ref 26.0–34.0)
MCHC: 32.7 g/dL (ref 32.0–36.0)
Monocyte #: 0.6 x10 3/mm (ref 0.0–0.7)
Neutrophil #: 4 x10 3/mm (ref 1.4–6.5)
RBC: 3.36 10*6/uL — ABNORMAL LOW (ref 3.80–5.20)
RDW: 21.8 % — ABNORMAL HIGH (ref 11.5–14.5)

## 2011-11-28 LAB — COMPREHENSIVE METABOLIC PANEL
Albumin: 2.5 g/dL — ABNORMAL LOW (ref 3.4–5.0)
Alkaline Phosphatase: 112 U/L (ref 50–136)
Anion Gap: 8 (ref 7–16)
Calcium, Total: 8.6 mg/dL (ref 8.5–10.1)
EGFR (African American): >60
Potassium: 3.5 mmol/L (ref 3.5–5.1)
SGOT(AST): 14 U/L — ABNORMAL LOW (ref 15–37)

## 2011-11-28 LAB — PROTIME-INR
INR: 2.4
Prothrombin Time: 26.5 secs — ABNORMAL HIGH (ref 11.5–14.7)

## 2011-12-04 ENCOUNTER — Other Ambulatory Visit: Payer: Self-pay | Admitting: Geriatric Medicine

## 2011-12-04 LAB — CBC WITH DIFFERENTIAL/PLATELET
Basophil #: 0 x10 3/mm 3
Basophil %: 0.1 %
Eosinophil #: 0 x10 3/mm 3
Eosinophil %: 1.1 %
HCT: 28.2 % — ABNORMAL LOW
HGB: 9.4 g/dL — ABNORMAL LOW
Lymphocyte %: 27.6 %
Lymphs Abs: 0.9 x10 3/mm 3 — ABNORMAL LOW
MCH: 28 pg
MCHC: 33.4 g/dL
MCV: 84 fL
Monocyte #: 0.1 x10 3/mm 3
Monocyte %: 3.9 %
Neutrophil #: 2.3 x10 3/mm 3
Neutrophil %: 67.3 %
Platelet: 161 x10 3/mm 3
RBC: 3.37 X10 6/mm 3 — ABNORMAL LOW
RDW: 20.6 % — ABNORMAL HIGH
WBC: 3.3 x10 3/mm 3 — ABNORMAL LOW

## 2011-12-04 LAB — PROTIME-INR
INR: 1.7
Prothrombin Time: 20.2 secs — ABNORMAL HIGH (ref 11.5–14.7)

## 2011-12-05 LAB — CBC CANCER CENTER
Basophil #: 0 x10 3/mm (ref 0.0–0.1)
Basophil %: 0.5 %
Eosinophil #: 0 x10 3/mm (ref 0.0–0.7)
HGB: 9.1 g/dL — ABNORMAL LOW (ref 12.0–16.0)
Lymphocyte %: 23.6 %
MCHC: 32.7 g/dL (ref 32.0–36.0)
Monocyte #: 0.2 x10 3/mm (ref 0.0–0.7)
Monocyte %: 7.3 %
Neutrophil %: 67.2 %
RDW: 21.8 % — ABNORMAL HIGH (ref 11.5–14.5)

## 2011-12-20 ENCOUNTER — Ambulatory Visit: Payer: Self-pay | Admitting: Oncology

## 2011-12-26 LAB — COMPREHENSIVE METABOLIC PANEL
Albumin: 3 g/dL — ABNORMAL LOW (ref 3.4–5.0)
Bilirubin,Total: 0.3 mg/dL (ref 0.2–1.0)
SGOT(AST): 16 U/L (ref 15–37)
SGPT (ALT): 13 U/L
Sodium: 140 mmol/L (ref 136–145)
Total Protein: 8.7 g/dL — ABNORMAL HIGH (ref 6.4–8.2)

## 2011-12-26 LAB — CBC CANCER CENTER
Basophil #: 0 x10 3/mm (ref 0.0–0.1)
Eosinophil #: 0.1 x10 3/mm (ref 0.0–0.7)
Eosinophil %: 1.4 %
Lymphocyte #: 0.9 x10 3/mm — ABNORMAL LOW (ref 1.0–3.6)
Lymphocyte %: 19.8 %
MCV: 86 fL (ref 80–100)
Monocyte %: 8.5 %
Neutrophil %: 70 %
Platelet: 229 x10 3/mm (ref 150–440)
RDW: 22.2 % — ABNORMAL HIGH (ref 11.5–14.5)
WBC: 4.7 x10 3/mm (ref 3.6–11.0)

## 2011-12-26 LAB — PROTIME-INR: INR: 2.1

## 2012-01-02 LAB — CBC CANCER CENTER
Basophil #: 0 "x10 3/mm "
Basophil %: 0.1 %
Eosinophil #: 0 "x10 3/mm "
Eosinophil %: 0.9 %
HCT: 30.1 % — ABNORMAL LOW
HGB: 10 g/dL — ABNORMAL LOW
Lymphocyte %: 20.9 %
Lymphs Abs: 0.8 "x10 3/mm " — ABNORMAL LOW
MCH: 28.5 pg
MCHC: 33.3 g/dL
MCV: 86 fL
Monocyte #: 0.2 "x10 3/mm "
Monocyte %: 4.9 %
Neutrophil #: 2.8 "x10 3/mm "
Neutrophil %: 73.2 %
Platelet: 189 "x10 3/mm "
RBC: 3.51 "x10 6/mm " — ABNORMAL LOW
RDW: 21.8 % — ABNORMAL HIGH
WBC: 3.8 "x10 3/mm "

## 2012-01-02 LAB — PROTIME-INR
INR: 2.1
Prothrombin Time: 24.1 s — ABNORMAL HIGH

## 2012-01-16 LAB — COMPREHENSIVE METABOLIC PANEL
Alkaline Phosphatase: 114 U/L (ref 50–136)
BUN: 13 mg/dL (ref 7–18)
Bilirubin,Total: 0.3 mg/dL (ref 0.2–1.0)
Chloride: 101 mmol/L (ref 98–107)
Creatinine: 0.82 mg/dL (ref 0.60–1.30)
EGFR (African American): >60
Glucose: 96 mg/dL (ref 65–99)
Osmolality: 279 (ref 275–301)
SGOT(AST): 17 U/L (ref 15–37)
SGPT (ALT): 16 U/L
Sodium: 140 mmol/L (ref 136–145)
Total Protein: 8.7 g/dL — ABNORMAL HIGH (ref 6.4–8.2)

## 2012-01-16 LAB — PROTIME-INR: Prothrombin Time: 20.8 secs — ABNORMAL HIGH (ref 11.5–14.7)

## 2012-01-16 LAB — CBC CANCER CENTER
Basophil #: 0 x10 3/mm (ref 0.0–0.1)
Basophil %: 0.4 %
Eosinophil %: 0.7 %
HCT: 28.8 % — ABNORMAL LOW (ref 35.0–47.0)
HGB: 9.3 g/dL — ABNORMAL LOW (ref 12.0–16.0)
Lymphocyte #: 0.8 x10 3/mm — ABNORMAL LOW (ref 1.0–3.6)
MCH: 28.4 pg (ref 26.0–34.0)
MCV: 88 fL (ref 80–100)
Monocyte #: 0.4 x10 3/mm (ref 0.0–0.7)
Monocyte %: 9.1 %
Neutrophil %: 71.7 %
RBC: 3.28 10*6/uL — ABNORMAL LOW (ref 3.80–5.20)
WBC: 4.6 x10 3/mm (ref 3.6–11.0)

## 2012-01-17 ENCOUNTER — Ambulatory Visit: Payer: Self-pay | Admitting: Oncology

## 2012-01-23 LAB — CBC CANCER CENTER
Basophil #: 0 x10 3/mm (ref 0.0–0.1)
Basophil %: 0.5 %
Eosinophil #: 0.1 x10 3/mm (ref 0.0–0.7)
Eosinophil %: 2.1 %
HCT: 26.6 % — ABNORMAL LOW (ref 35.0–47.0)
HGB: 9 g/dL — ABNORMAL LOW (ref 12.0–16.0)
Lymphocyte %: 36 %
Monocyte %: 3.7 %
Neutrophil #: 1.4 x10 3/mm (ref 1.4–6.5)
RBC: 3.06 10*6/uL — ABNORMAL LOW (ref 3.80–5.20)
WBC: 2.4 x10 3/mm — ABNORMAL LOW (ref 3.6–11.0)

## 2012-02-06 LAB — CBC CANCER CENTER
Eosinophil %: 0.3 %
Lymphocyte #: 1.3 x10 3/mm (ref 1.0–3.6)
MCH: 28.9 pg (ref 26.0–34.0)
MCHC: 32.6 g/dL (ref 32.0–36.0)
MCV: 89 fL (ref 80–100)
Monocyte #: 0.6 x10 3/mm (ref 0.0–0.7)
Monocyte %: 13.6 %
Neutrophil #: 2.5 x10 3/mm (ref 1.4–6.5)
Platelet: 140 x10 3/mm — ABNORMAL LOW (ref 150–440)
RDW: 21.3 % — ABNORMAL HIGH (ref 11.5–14.5)

## 2012-02-06 LAB — COMPREHENSIVE METABOLIC PANEL
Albumin: 3.3 g/dL — ABNORMAL LOW (ref 3.4–5.0)
Anion Gap: 8 (ref 7–16)
BUN: 13 mg/dL (ref 7–18)
Calcium, Total: 8.7 mg/dL (ref 8.5–10.1)
Chloride: 102 mmol/L (ref 98–107)
Co2: 27 mmol/L (ref 21–32)
EGFR (Non-African Amer.): >60
Glucose: 97 mg/dL (ref 65–99)
Osmolality: 274 (ref 275–301)
Potassium: 3.7 mmol/L (ref 3.5–5.1)
SGOT(AST): 21 U/L (ref 15–37)
SGPT (ALT): 18 U/L
Sodium: 137 mmol/L (ref 136–145)

## 2012-02-06 LAB — PROTIME-INR: Prothrombin Time: 19.7 secs — ABNORMAL HIGH (ref 11.5–14.7)

## 2012-02-13 LAB — CBC CANCER CENTER
Basophil #: 0 x10 3/mm (ref 0.0–0.1)
Basophil %: 0.3 %
Lymphocyte #: 0.8 x10 3/mm — ABNORMAL LOW (ref 1.0–3.6)
Lymphocyte %: 26.2 %
MCH: 28.9 pg (ref 26.0–34.0)
MCHC: 32.9 g/dL (ref 32.0–36.0)
MCV: 88 fL (ref 80–100)
Platelet: 93 x10 3/mm — ABNORMAL LOW (ref 150–440)
RBC: 3.27 10*6/uL — ABNORMAL LOW (ref 3.80–5.20)
RDW: 21.9 % — ABNORMAL HIGH (ref 11.5–14.5)

## 2012-02-13 LAB — PROTIME-INR
INR: 1.4
Prothrombin Time: 17.5 secs — ABNORMAL HIGH (ref 11.5–14.7)

## 2012-02-17 ENCOUNTER — Ambulatory Visit: Payer: Self-pay | Admitting: Oncology

## 2012-02-20 ENCOUNTER — Ambulatory Visit: Payer: Self-pay | Admitting: Vascular Surgery

## 2012-02-20 LAB — PROTIME-INR: Prothrombin Time: 18.6 secs — ABNORMAL HIGH (ref 11.5–14.7)

## 2012-03-05 LAB — CBC CANCER CENTER
Eosinophil #: 0.1 x10 3/mm (ref 0.0–0.7)
Eosinophil %: 1.5 %
HCT: 28.4 % — ABNORMAL LOW (ref 35.0–47.0)
MCHC: 31.3 g/dL — ABNORMAL LOW (ref 32.0–36.0)
MCV: 90 fL (ref 80–100)
Monocyte #: 0.4 x10 3/mm (ref 0.2–0.9)
Monocyte %: 10.2 %
Neutrophil #: 2.4 x10 3/mm (ref 1.4–6.5)
Neutrophil %: 60.6 %
RBC: 3.14 10*6/uL — ABNORMAL LOW (ref 3.80–5.20)
WBC: 4 x10 3/mm (ref 3.6–11.0)

## 2012-03-05 LAB — COMPREHENSIVE METABOLIC PANEL
Albumin: 3 g/dL — ABNORMAL LOW (ref 3.4–5.0)
Bilirubin,Total: 0.3 mg/dL (ref 0.2–1.0)
Calcium, Total: 8.8 mg/dL (ref 8.5–10.1)
Creatinine: 0.84 mg/dL (ref 0.60–1.30)
EGFR (African American): >60
Glucose: 121 mg/dL — ABNORMAL HIGH (ref 65–99)
Osmolality: 280 (ref 275–301)
Potassium: 4 mmol/L (ref 3.5–5.1)
SGOT(AST): 30 U/L (ref 15–37)
SGPT (ALT): 19 U/L
Sodium: 139 mmol/L (ref 136–145)

## 2012-03-05 LAB — PROTIME-INR: Prothrombin Time: 25.2 secs — ABNORMAL HIGH (ref 11.5–14.7)

## 2012-03-12 LAB — PROTIME-INR: Prothrombin Time: 26 secs — ABNORMAL HIGH (ref 11.5–14.7)

## 2012-03-16 LAB — CBC CANCER CENTER
Basophil #: 0 x10 3/mm (ref 0.0–0.1)
Lymphocyte #: 1.2 x10 3/mm (ref 1.0–3.6)
Lymphocyte %: 26.9 %
MCH: 28 pg (ref 26.0–34.0)
MCHC: 30.7 g/dL — ABNORMAL LOW (ref 32.0–36.0)
MCV: 91 fL (ref 80–100)
Monocyte #: 0.4 x10 3/mm (ref 0.2–0.9)
Neutrophil #: 2.7 x10 3/mm (ref 1.4–6.5)
Neutrophil %: 62.2 %
Platelet: 164 x10 3/mm (ref 150–440)

## 2012-03-16 LAB — PROTIME-INR
INR: 2.3
Prothrombin Time: 25.3 secs — ABNORMAL HIGH (ref 11.5–14.7)

## 2012-03-18 ENCOUNTER — Ambulatory Visit: Payer: Self-pay | Admitting: Oncology

## 2012-03-23 LAB — PATHOLOGY REPORT

## 2012-03-26 LAB — PROTIME-INR: Prothrombin Time: 18.4 secs — ABNORMAL HIGH (ref 11.5–14.7)

## 2012-04-02 LAB — PROTIME-INR
INR: 2.6
Prothrombin Time: 28.4 secs — ABNORMAL HIGH (ref 11.5–14.7)

## 2012-04-09 LAB — CBC CANCER CENTER
Basophil #: 0 x10 3/mm (ref 0.0–0.1)
Basophil %: 0.2 %
Eosinophil #: 0 x10 3/mm (ref 0.0–0.7)
HCT: 30.8 % — ABNORMAL LOW (ref 35.0–47.0)
HGB: 9.5 g/dL — ABNORMAL LOW (ref 12.0–16.0)
Lymphocyte #: 0.9 x10 3/mm — ABNORMAL LOW (ref 1.0–3.6)
MCV: 90 fL (ref 80–100)
Monocyte #: 0.3 x10 3/mm (ref 0.2–0.9)
Neutrophil #: 1.4 x10 3/mm (ref 1.4–6.5)
Neutrophil %: 52.1 %
Platelet: 146 x10 3/mm — ABNORMAL LOW (ref 150–440)
WBC: 2.7 x10 3/mm — ABNORMAL LOW (ref 3.6–11.0)

## 2012-04-09 LAB — PROTIME-INR: INR: 4

## 2012-04-16 LAB — CBC CANCER CENTER
Basophil #: 0 x10 3/mm (ref 0.0–0.1)
Eosinophil #: 0 x10 3/mm (ref 0.0–0.7)
HGB: 9.4 g/dL — ABNORMAL LOW (ref 12.0–16.0)
Lymphocyte %: 22.9 %
MCHC: 31.1 g/dL — ABNORMAL LOW (ref 32.0–36.0)
Monocyte %: 9.3 %
Neutrophil %: 66.5 %
Platelet: 130 x10 3/mm — ABNORMAL LOW (ref 150–440)

## 2012-04-16 LAB — PROTIME-INR: INR: 3.5

## 2012-04-18 ENCOUNTER — Ambulatory Visit: Payer: Self-pay | Admitting: Oncology

## 2012-04-23 LAB — CBC CANCER CENTER
Basophil #: 0 x10 3/mm (ref 0.0–0.1)
Basophil %: 0.2 %
Eosinophil #: 0.1 x10 3/mm (ref 0.0–0.7)
Eosinophil %: 3 %
Lymphocyte #: 0.6 x10 3/mm — ABNORMAL LOW (ref 1.0–3.6)
MCHC: 31.7 g/dL — ABNORMAL LOW (ref 32.0–36.0)
MCV: 90 fL (ref 80–100)
Monocyte #: 0.2 x10 3/mm (ref 0.2–0.9)
Monocyte %: 10.3 %
Neutrophil #: 1.3 x10 3/mm — ABNORMAL LOW (ref 1.4–6.5)
RBC: 3.26 10*6/uL — ABNORMAL LOW (ref 3.80–5.20)
WBC: 2.2 x10 3/mm — ABNORMAL LOW (ref 3.6–11.0)

## 2012-04-30 LAB — CBC CANCER CENTER
Basophil #: 0 x10 3/mm (ref 0.0–0.1)
Basophil %: 0.2 %
Eosinophil %: 2.4 %
HCT: 31 % — ABNORMAL LOW (ref 35.0–47.0)
Lymphocyte #: 0.5 x10 3/mm — ABNORMAL LOW (ref 1.0–3.6)
MCH: 28.6 pg (ref 26.0–34.0)
MCHC: 31.4 g/dL — ABNORMAL LOW (ref 32.0–36.0)
MCV: 91 fL (ref 80–100)
Monocyte #: 0.3 x10 3/mm (ref 0.2–0.9)
Neutrophil #: 1.3 x10 3/mm — ABNORMAL LOW (ref 1.4–6.5)
Neutrophil %: 59.6 %
Platelet: 122 x10 3/mm — ABNORMAL LOW (ref 150–440)
RBC: 3.42 10*6/uL — ABNORMAL LOW (ref 3.80–5.20)
RDW: 21.5 % — ABNORMAL HIGH (ref 11.5–14.5)
WBC: 2.1 x10 3/mm — ABNORMAL LOW (ref 3.6–11.0)

## 2012-04-30 LAB — PROTIME-INR: Prothrombin Time: 22.9 secs — ABNORMAL HIGH (ref 11.5–14.7)

## 2012-05-07 LAB — CBC CANCER CENTER
Basophil #: 0 x10 3/mm (ref 0.0–0.1)
Eosinophil #: 0 x10 3/mm (ref 0.0–0.7)
HCT: 32.1 % — ABNORMAL LOW (ref 35.0–47.0)
HGB: 10 g/dL — ABNORMAL LOW (ref 12.0–16.0)
Lymphocyte #: 0.4 x10 3/mm — ABNORMAL LOW (ref 1.0–3.6)
MCHC: 31.2 g/dL — ABNORMAL LOW (ref 32.0–36.0)
Monocyte #: 0.3 x10 3/mm (ref 0.2–0.9)
Monocyte %: 14.9 %
Neutrophil #: 1.4 x10 3/mm (ref 1.4–6.5)
Neutrophil %: 63.7 %
Platelet: 133 x10 3/mm — ABNORMAL LOW (ref 150–440)

## 2012-05-07 LAB — PROTIME-INR
INR: 1.7
Prothrombin Time: 20.4 secs — ABNORMAL HIGH (ref 11.5–14.7)

## 2012-05-14 LAB — PROTIME-INR
INR: 2.4
Prothrombin Time: 26.3 secs — ABNORMAL HIGH (ref 11.5–14.7)

## 2012-05-18 ENCOUNTER — Ambulatory Visit: Payer: Self-pay | Admitting: Oncology

## 2012-05-22 LAB — PROTIME-INR
INR: 1.8
Prothrombin Time: 21.6 secs — ABNORMAL HIGH (ref 11.5–14.7)

## 2012-05-28 LAB — CBC CANCER CENTER
Eosinophil #: 0 x10 3/mm (ref 0.0–0.7)
HGB: 9.8 g/dL — ABNORMAL LOW (ref 12.0–16.0)
Lymphocyte %: 7.3 %
MCH: 28.3 pg (ref 26.0–34.0)
Monocyte #: 0.3 x10 3/mm (ref 0.2–0.9)
Neutrophil %: 83 %
Platelet: 98 x10 3/mm — ABNORMAL LOW (ref 150–440)
RBC: 3.46 10*6/uL — ABNORMAL LOW (ref 3.80–5.20)
WBC: 3.8 x10 3/mm (ref 3.6–11.0)

## 2012-05-28 LAB — IRON AND TIBC
Iron Bind.Cap.(Total): 253 ug/dL (ref 250–450)
Iron Saturation: 20 %
Iron: 51 ug/dL (ref 50–170)

## 2012-05-28 LAB — FERRITIN: Ferritin (ARMC): 423 ng/mL — ABNORMAL HIGH (ref 8–388)

## 2012-06-04 LAB — CBC CANCER CENTER
Basophil #: 0 x10 3/mm (ref 0.0–0.1)
Basophil %: 0.2 %
HCT: 29.3 % — ABNORMAL LOW (ref 35.0–47.0)
HGB: 9.1 g/dL — ABNORMAL LOW (ref 12.0–16.0)
Lymphocyte %: 10.4 %
Monocyte %: 6.1 %
Neutrophil #: 3.1 x10 3/mm (ref 1.4–6.5)
WBC: 3.8 x10 3/mm (ref 3.6–11.0)

## 2012-06-04 LAB — PROTIME-INR: INR: 1.4

## 2012-06-11 LAB — PROTIME-INR
INR: 2.2
Prothrombin Time: 24.4 secs — ABNORMAL HIGH (ref 11.5–14.7)

## 2012-06-16 LAB — COMPREHENSIVE METABOLIC PANEL
Alkaline Phosphatase: 107 U/L (ref 50–136)
BUN: 15 mg/dL (ref 7–18)
Bilirubin,Total: 0.3 mg/dL (ref 0.2–1.0)
Calcium, Total: 9 mg/dL (ref 8.5–10.1)
Co2: 27 mmol/L (ref 21–32)
Creatinine: 1 mg/dL (ref 0.60–1.30)
EGFR (African American): >60
EGFR (Non-African Amer.): >60
Glucose: 92 mg/dL (ref 65–99)
Osmolality: 278 (ref 275–301)
SGOT(AST): 21 U/L (ref 15–37)

## 2012-06-16 LAB — PROTIME-INR
INR: 2.3
Prothrombin Time: 25.9 secs — ABNORMAL HIGH (ref 11.5–14.7)

## 2012-06-16 LAB — CBC CANCER CENTER
Basophil %: 0.3 %
Eosinophil #: 0.1 x10 3/mm (ref 0.0–0.7)
Eosinophil %: 1.7 %
HGB: 9.5 g/dL — ABNORMAL LOW (ref 12.0–16.0)
Lymphocyte %: 17.9 %
Neutrophil %: 69.3 %
RBC: 3.25 10*6/uL — ABNORMAL LOW (ref 3.80–5.20)

## 2012-06-18 ENCOUNTER — Ambulatory Visit: Payer: Self-pay | Admitting: Oncology

## 2012-06-23 LAB — CBC CANCER CENTER
Basophil %: 0.2 %
Eosinophil %: 1.3 %
HGB: 9.7 g/dL — ABNORMAL LOW (ref 12.0–16.0)
Lymphocyte #: 0.5 x10 3/mm — ABNORMAL LOW (ref 1.0–3.6)
MCH: 29.4 pg (ref 26.0–34.0)
MCHC: 32.1 g/dL (ref 32.0–36.0)
MCV: 92 fL (ref 80–100)
Monocyte #: 0.2 x10 3/mm (ref 0.2–0.9)
Platelet: 188 x10 3/mm (ref 150–440)
RBC: 3.3 10*6/uL — ABNORMAL LOW (ref 3.80–5.20)

## 2012-06-23 LAB — COMPREHENSIVE METABOLIC PANEL
Anion Gap: 7 (ref 7–16)
BUN: 14 mg/dL (ref 7–18)
Calcium, Total: 8.8 mg/dL (ref 8.5–10.1)
Chloride: 108 mmol/L — ABNORMAL HIGH (ref 98–107)
Co2: 27 mmol/L (ref 21–32)
Creatinine: 0.96 mg/dL (ref 0.60–1.30)
EGFR (African American): >60
EGFR (Non-African Amer.): >60
Osmolality: 284 (ref 275–301)
SGOT(AST): 24 U/L (ref 15–37)

## 2012-06-30 LAB — COMPREHENSIVE METABOLIC PANEL
Anion Gap: 9 (ref 7–16)
Creatinine: 1.02 mg/dL (ref 0.60–1.30)
EGFR (African American): >60
EGFR (Non-African Amer.): 59 — ABNORMAL LOW
Glucose: 96 mg/dL (ref 65–99)
Osmolality: 275 (ref 275–301)
SGPT (ALT): 22 U/L (ref 12–78)
Sodium: 138 mmol/L (ref 136–145)

## 2012-06-30 LAB — CBC CANCER CENTER
Basophil #: 0 x10 3/mm (ref 0.0–0.1)
Eosinophil #: 0 x10 3/mm (ref 0.0–0.7)
HGB: 8.9 g/dL — ABNORMAL LOW (ref 12.0–16.0)
Lymphocyte %: 17.4 %
MCHC: 31.2 g/dL — ABNORMAL LOW (ref 32.0–36.0)
Monocyte #: 0.1 x10 3/mm — ABNORMAL LOW (ref 0.2–0.9)
Monocyte %: 3.8 %
Neutrophil %: 77.8 %
RDW: 20.1 % — ABNORMAL HIGH (ref 11.5–14.5)
WBC: 2.2 x10 3/mm — ABNORMAL LOW (ref 3.6–11.0)

## 2012-06-30 LAB — PROTIME-INR: INR: 2.4

## 2012-07-07 LAB — CBC CANCER CENTER
Eosinophil #: 0 x10 3/mm (ref 0.0–0.7)
Eosinophil %: 0.2 %
HGB: 9.3 g/dL — ABNORMAL LOW (ref 12.0–16.0)
Lymphocyte %: 15.9 %
MCH: 29.1 pg (ref 26.0–34.0)
MCHC: 31.8 g/dL — ABNORMAL LOW (ref 32.0–36.0)
Monocyte #: 0.3 x10 3/mm (ref 0.2–0.9)
Neutrophil %: 69.6 %
Platelet: 138 x10 3/mm — ABNORMAL LOW (ref 150–440)
RBC: 3.19 10*6/uL — ABNORMAL LOW (ref 3.80–5.20)

## 2012-07-07 LAB — COMPREHENSIVE METABOLIC PANEL
Albumin: 3.3 g/dL — ABNORMAL LOW (ref 3.4–5.0)
Anion Gap: 10 (ref 7–16)
Calcium, Total: 9.1 mg/dL (ref 8.5–10.1)
Chloride: 99 mmol/L (ref 98–107)
Glucose: 89 mg/dL (ref 65–99)
Osmolality: 273 (ref 275–301)
Potassium: 3.7 mmol/L (ref 3.5–5.1)
SGOT(AST): 25 U/L (ref 15–37)
Sodium: 137 mmol/L (ref 136–145)

## 2012-07-07 LAB — PROTIME-INR
INR: 4
Prothrombin Time: 38.7 secs — ABNORMAL HIGH (ref 11.5–14.7)

## 2012-07-14 LAB — CBC CANCER CENTER
Basophil %: 0.2 %
Eosinophil #: 0 x10 3/mm (ref 0.0–0.7)
Lymphocyte #: 0.4 x10 3/mm — ABNORMAL LOW (ref 1.0–3.6)
Lymphocyte %: 11.2 %
MCH: 29.2 pg (ref 26.0–34.0)
MCHC: 32 g/dL (ref 32.0–36.0)
Monocyte #: 0.7 x10 3/mm (ref 0.2–0.9)
Neutrophil %: 70 %
RDW: 20.8 % — ABNORMAL HIGH (ref 11.5–14.5)

## 2012-07-14 LAB — COMPREHENSIVE METABOLIC PANEL
Albumin: 3 g/dL — ABNORMAL LOW (ref 3.4–5.0)
BUN: 11 mg/dL (ref 7–18)
Chloride: 101 mmol/L (ref 98–107)
EGFR (African American): >60
Glucose: 90 mg/dL (ref 65–99)
SGOT(AST): 19 U/L (ref 15–37)
SGPT (ALT): 17 U/L (ref 12–78)
Total Protein: 8.2 g/dL (ref 6.4–8.2)

## 2012-07-14 LAB — PROTIME-INR: INR: 4.7

## 2012-07-19 ENCOUNTER — Ambulatory Visit: Payer: Self-pay | Admitting: Oncology

## 2012-07-21 LAB — CBC CANCER CENTER
Basophil %: 0.3 %
Eosinophil %: 0.4 %
HGB: 8.1 g/dL — ABNORMAL LOW (ref 12.0–16.0)
Lymphocyte #: 0.4 x10 3/mm — ABNORMAL LOW (ref 1.0–3.6)
MCV: 93 fL (ref 80–100)
Monocyte #: 0.2 x10 3/mm (ref 0.2–0.9)
Monocyte %: 7.2 %
Neutrophil %: 78.1 %
Platelet: 216 x10 3/mm (ref 150–440)
RBC: 2.87 10*6/uL — ABNORMAL LOW (ref 3.80–5.20)
WBC: 3.1 x10 3/mm — ABNORMAL LOW (ref 3.6–11.0)

## 2012-07-27 ENCOUNTER — Inpatient Hospital Stay: Payer: Self-pay | Admitting: Internal Medicine

## 2012-07-27 LAB — COMPREHENSIVE METABOLIC PANEL
BUN: 10 mg/dL (ref 7–18)
Bilirubin,Total: 0.4 mg/dL (ref 0.2–1.0)
Calcium, Total: 9.5 mg/dL (ref 8.5–10.1)
Chloride: 100 mmol/L (ref 98–107)
EGFR (African American): >60
EGFR (Non-African Amer.): >60
Osmolality: 272 (ref 275–301)
Potassium: 3.6 mmol/L (ref 3.5–5.1)
SGOT(AST): 22 U/L (ref 15–37)
SGPT (ALT): 13 U/L (ref 12–78)
Total Protein: 8.9 g/dL — ABNORMAL HIGH (ref 6.4–8.2)

## 2012-07-27 LAB — CBC
HCT: 26.5 % — ABNORMAL LOW (ref 35.0–47.0)
MCH: 29.4 pg (ref 26.0–34.0)
MCHC: 32.8 g/dL (ref 32.0–36.0)
MCV: 90 fL (ref 80–100)
RDW: 21.1 % — ABNORMAL HIGH (ref 11.5–14.5)
WBC: 3.5 10*3/uL — ABNORMAL LOW (ref 3.6–11.0)

## 2012-07-27 LAB — URINALYSIS, COMPLETE
Bilirubin,UR: NEGATIVE
Ph: 6 (ref 4.5–8.0)
Protein: 30
RBC,UR: 5 /HPF (ref 0–5)
Squamous Epithelial: 2
WBC UR: 97 /HPF (ref 0–5)

## 2012-07-27 LAB — PROTIME-INR: Prothrombin Time: 36.6 secs — ABNORMAL HIGH (ref 11.5–14.7)

## 2012-07-28 LAB — CBC WITH DIFFERENTIAL/PLATELET
Basophil #: 0 10*3/uL (ref 0.0–0.1)
Eosinophil #: 0 10*3/uL (ref 0.0–0.7)
Eosinophil %: 0.3 %
HGB: 7.6 g/dL — ABNORMAL LOW (ref 12.0–16.0)
Lymphocyte #: 0.4 10*3/uL — ABNORMAL LOW (ref 1.0–3.6)
MCH: 29.7 pg (ref 26.0–34.0)
MCHC: 33 g/dL (ref 32.0–36.0)
MCV: 90 fL (ref 80–100)
Monocyte #: 0.3 x10 3/mm (ref 0.2–0.9)
Monocyte %: 8.1 %
Neutrophil #: 2.6 10*3/uL (ref 1.4–6.5)
Neutrophil %: 79.1 %
Platelet: 168 10*3/uL (ref 150–440)
RDW: 21.3 % — ABNORMAL HIGH (ref 11.5–14.5)

## 2012-07-28 LAB — BASIC METABOLIC PANEL
BUN: 7 mg/dL (ref 7–18)
Creatinine: 0.82 mg/dL (ref 0.60–1.30)
EGFR (Non-African Amer.): >60
Glucose: 124 mg/dL — ABNORMAL HIGH (ref 65–99)
Osmolality: 277 (ref 275–301)
Potassium: 3.5 mmol/L (ref 3.5–5.1)
Sodium: 139 mmol/L (ref 136–145)

## 2012-07-28 LAB — URINE CULTURE

## 2012-07-28 LAB — PROTIME-INR: INR: 4.8

## 2012-07-29 LAB — CBC WITH DIFFERENTIAL/PLATELET
Basophil #: 0 10*3/uL (ref 0.0–0.1)
Basophil %: 0.1 %
Eosinophil #: 0 10*3/uL (ref 0.0–0.7)
HCT: 25 % — ABNORMAL LOW (ref 35.0–47.0)
HGB: 8.3 g/dL — ABNORMAL LOW (ref 12.0–16.0)
Lymphocyte %: 8.2 %
MCH: 29.5 pg (ref 26.0–34.0)
Monocyte #: 0.1 x10 3/mm — ABNORMAL LOW (ref 0.2–0.9)
Neutrophil %: 88.1 %
Platelet: 158 10*3/uL (ref 150–440)
RBC: 2.8 10*6/uL — ABNORMAL LOW (ref 3.80–5.20)
RDW: 19.7 % — ABNORMAL HIGH (ref 11.5–14.5)
WBC: 3.5 10*3/uL — ABNORMAL LOW (ref 3.6–11.0)

## 2012-07-29 LAB — PROTIME-INR
INR: 2.1
Prothrombin Time: 23.8 secs — ABNORMAL HIGH (ref 11.5–14.7)

## 2012-07-30 LAB — PROTIME-INR
INR: 2.2
Prothrombin Time: 25 s — ABNORMAL HIGH

## 2012-07-31 ENCOUNTER — Emergency Department: Payer: Self-pay | Admitting: Emergency Medicine

## 2012-07-31 LAB — CBC WITH DIFFERENTIAL/PLATELET
Basophil #: 0 10*3/uL (ref 0.0–0.1)
Basophil %: 0.1 %
Eosinophil #: 0 10*3/uL (ref 0.0–0.7)
HCT: 31.3 % — ABNORMAL LOW (ref 35.0–47.0)
Lymphocyte #: 0.4 10*3/uL — ABNORMAL LOW (ref 1.0–3.6)
MCH: 29.9 pg (ref 26.0–34.0)
MCHC: 33.4 g/dL (ref 32.0–36.0)
MCV: 89 fL (ref 80–100)
Monocyte #: 0.4 x10 3/mm (ref 0.2–0.9)
Monocyte %: 11.7 %
Neutrophil #: 2.5 10*3/uL (ref 1.4–6.5)
Platelet: 238 10*3/uL (ref 150–440)
RDW: 19.9 % — ABNORMAL HIGH (ref 11.5–14.5)

## 2012-07-31 LAB — COMPREHENSIVE METABOLIC PANEL
Anion Gap: 11 (ref 7–16)
BUN: 13 mg/dL (ref 7–18)
Bilirubin,Total: 0.7 mg/dL (ref 0.2–1.0)
Calcium, Total: 9.8 mg/dL (ref 8.5–10.1)
Chloride: 102 mmol/L (ref 98–107)
EGFR (African American): >60
EGFR (Non-African Amer.): >60
Glucose: 103 mg/dL — ABNORMAL HIGH (ref 65–99)
Potassium: 2.7 mmol/L — ABNORMAL LOW (ref 3.5–5.1)
SGOT(AST): 23 U/L (ref 15–37)
SGPT (ALT): 15 U/L (ref 12–78)
Total Protein: 9.2 g/dL — ABNORMAL HIGH (ref 6.4–8.2)

## 2012-08-04 LAB — PROTIME-INR
INR: 1.4
Prothrombin Time: 17.8 secs — ABNORMAL HIGH (ref 11.5–14.7)

## 2012-08-11 LAB — CBC CANCER CENTER
Basophil %: 0.3 %
Eosinophil %: 0.5 %
HCT: 29.8 % — ABNORMAL LOW (ref 35.0–47.0)
HGB: 9.5 g/dL — ABNORMAL LOW (ref 12.0–16.0)
Lymphocyte #: 0.7 x10 3/mm — ABNORMAL LOW (ref 1.0–3.6)
MCH: 29.3 pg (ref 26.0–34.0)
MCV: 92 fL (ref 80–100)
Monocyte #: 0.5 x10 3/mm (ref 0.2–0.9)
Neutrophil #: 3.1 x10 3/mm (ref 1.4–6.5)
Neutrophil %: 71.4 %
RBC: 3.23 10*6/uL — ABNORMAL LOW (ref 3.80–5.20)

## 2012-08-11 LAB — COMPREHENSIVE METABOLIC PANEL
Albumin: 3.3 g/dL — ABNORMAL LOW (ref 3.4–5.0)
Anion Gap: 9 (ref 7–16)
BUN: 13 mg/dL (ref 7–18)
Bilirubin,Total: 0.3 mg/dL (ref 0.2–1.0)
Calcium, Total: 9.4 mg/dL (ref 8.5–10.1)
Co2: 25 mmol/L (ref 21–32)
Creatinine: 1.1 mg/dL (ref 0.60–1.30)
EGFR (African American): >60
Osmolality: 274 (ref 275–301)
SGPT (ALT): 15 U/L (ref 12–78)
Total Protein: 8.6 g/dL — ABNORMAL HIGH (ref 6.4–8.2)

## 2012-08-11 LAB — PROTIME-INR
INR: 2.3
Prothrombin Time: 25.6 secs — ABNORMAL HIGH (ref 11.5–14.7)

## 2012-08-18 ENCOUNTER — Ambulatory Visit: Payer: Self-pay | Admitting: Oncology

## 2012-08-18 LAB — CBC CANCER CENTER
Basophil #: 0 x10 3/mm (ref 0.0–0.1)
HCT: 30.2 % — ABNORMAL LOW (ref 35.0–47.0)
Lymphocyte #: 0.4 x10 3/mm — ABNORMAL LOW (ref 1.0–3.6)
MCHC: 31 g/dL — ABNORMAL LOW (ref 32.0–36.0)
MCV: 93 fL (ref 80–100)
Monocyte #: 0.3 x10 3/mm (ref 0.2–0.9)
Monocyte %: 7.6 %
Platelet: 275 x10 3/mm (ref 150–440)
RDW: 21.1 % — ABNORMAL HIGH (ref 11.5–14.5)
WBC: 3.5 x10 3/mm — ABNORMAL LOW (ref 3.6–11.0)

## 2012-08-18 LAB — COMPREHENSIVE METABOLIC PANEL
Albumin: 2.9 g/dL — ABNORMAL LOW (ref 3.4–5.0)
Alkaline Phosphatase: 85 U/L (ref 50–136)
Bilirubin,Total: 0.2 mg/dL (ref 0.2–1.0)
Calcium, Total: 9.4 mg/dL (ref 8.5–10.1)
Chloride: 103 mmol/L (ref 98–107)
EGFR (African American): >60
EGFR (Non-African Amer.): 53 — ABNORMAL LOW
Glucose: 129 mg/dL — ABNORMAL HIGH (ref 65–99)
Osmolality: 279 (ref 275–301)
SGOT(AST): 18 U/L (ref 15–37)
SGPT (ALT): 13 U/L (ref 12–78)
Sodium: 138 mmol/L (ref 136–145)

## 2012-08-18 LAB — PROTIME-INR
INR: 3.6
Prothrombin Time: 36.2 secs — ABNORMAL HIGH (ref 11.5–14.7)

## 2012-08-25 LAB — PROTIME-INR: INR: 2.7

## 2012-09-01 LAB — PROTIME-INR
INR: 2.5
Prothrombin Time: 27.2 secs — ABNORMAL HIGH (ref 11.5–14.7)

## 2012-09-08 LAB — CBC CANCER CENTER
Basophil %: 0.2 %
Eosinophil %: 0.3 %
HGB: 8.2 g/dL — ABNORMAL LOW (ref 12.0–16.0)
Lymphocyte #: 0.2 x10 3/mm — ABNORMAL LOW (ref 1.0–3.6)
Lymphocyte %: 4.2 %
MCH: 29.7 pg (ref 26.0–34.0)
MCHC: 31.5 g/dL — ABNORMAL LOW (ref 32.0–36.0)
MCV: 94 fL (ref 80–100)
Monocyte #: 0.7 x10 3/mm (ref 0.2–0.9)
Neutrophil %: 82 %
Platelet: 252 x10 3/mm (ref 150–440)
RDW: 21.8 % — ABNORMAL HIGH (ref 11.5–14.5)
WBC: 5.3 x10 3/mm (ref 3.6–11.0)

## 2012-09-08 LAB — COMPREHENSIVE METABOLIC PANEL
Albumin: 2.7 g/dL — ABNORMAL LOW (ref 3.4–5.0)
Alkaline Phosphatase: 102 U/L (ref 50–136)
BUN: 15 mg/dL (ref 7–18)
Calcium, Total: 9.4 mg/dL (ref 8.5–10.1)
Co2: 23 mmol/L (ref 21–32)
Creatinine: 1 mg/dL (ref 0.60–1.30)
EGFR (Non-African Amer.): 60 — ABNORMAL LOW
Glucose: 105 mg/dL — ABNORMAL HIGH (ref 65–99)
SGOT(AST): 18 U/L (ref 15–37)
SGPT (ALT): 13 U/L (ref 12–78)
Sodium: 139 mmol/L (ref 136–145)

## 2012-09-08 LAB — PROTIME-INR
INR: 3.8
Prothrombin Time: 37 s — ABNORMAL HIGH

## 2012-09-15 LAB — PROTIME-INR: INR: 3.2

## 2012-09-18 ENCOUNTER — Ambulatory Visit: Payer: Self-pay | Admitting: Oncology

## 2012-09-22 LAB — PROTIME-INR
INR: 1.9
Prothrombin Time: 21.8 secs — ABNORMAL HIGH (ref 11.5–14.7)

## 2012-09-29 LAB — PROTIME-INR: Prothrombin Time: 20.5 secs — ABNORMAL HIGH (ref 11.5–14.7)

## 2012-10-06 ENCOUNTER — Inpatient Hospital Stay: Payer: Self-pay | Admitting: Oncology

## 2012-10-06 LAB — COMPREHENSIVE METABOLIC PANEL
Anion Gap: 17 — ABNORMAL HIGH (ref 7–16)
BUN: 19 mg/dL — ABNORMAL HIGH (ref 7–18)
Bilirubin,Total: 0.5 mg/dL (ref 0.2–1.0)
Chloride: 100 mmol/L (ref 98–107)
Creatinine: 1.06 mg/dL (ref 0.60–1.30)
EGFR (African American): >60
Osmolality: 275 (ref 275–301)
Potassium: 2.6 mmol/L — ABNORMAL LOW (ref 3.5–5.1)
SGOT(AST): 17 U/L (ref 15–37)
Sodium: 137 mmol/L (ref 136–145)
Total Protein: 7.9 g/dL (ref 6.4–8.2)

## 2012-10-06 LAB — CBC CANCER CENTER
Basophil #: 0 x10 3/mm (ref 0.0–0.1)
Basophil %: 0.3 %
Eosinophil %: 0.2 %
HCT: 27.1 % — ABNORMAL LOW (ref 35.0–47.0)
HGB: 8.5 g/dL — ABNORMAL LOW (ref 12.0–16.0)
Lymphocyte #: 0.3 x10 3/mm — ABNORMAL LOW (ref 1.0–3.6)
Lymphocyte %: 7.7 %
MCH: 29.2 pg (ref 26.0–34.0)
MCV: 93 fL (ref 80–100)
Monocyte #: 0.4 x10 3/mm (ref 0.2–0.9)
Neutrophil #: 3 x10 3/mm (ref 1.4–6.5)
Platelet: 232 x10 3/mm (ref 150–440)
RDW: 24.9 % — ABNORMAL HIGH (ref 11.5–14.5)
WBC: 3.7 x10 3/mm (ref 3.6–11.0)

## 2012-10-06 LAB — PROTIME-INR
INR: 1.3
Prothrombin Time: 17 secs — ABNORMAL HIGH (ref 11.5–14.7)

## 2012-10-07 LAB — CBC WITH DIFFERENTIAL/PLATELET
Basophil #: 0 10*3/uL (ref 0.0–0.1)
Basophil %: 0.1 %
Eosinophil #: 0 10*3/uL (ref 0.0–0.7)
HGB: 7.5 g/dL — ABNORMAL LOW (ref 12.0–16.0)
Lymphocyte #: 0.2 10*3/uL — ABNORMAL LOW (ref 1.0–3.6)
Lymphocyte %: 5 %
MCH: 30.7 pg (ref 26.0–34.0)
MCHC: 33.7 g/dL (ref 32.0–36.0)
MCV: 91 fL (ref 80–100)
Neutrophil %: 91.3 %
Platelet: 198 10*3/uL (ref 150–440)

## 2012-10-07 LAB — BASIC METABOLIC PANEL
Anion Gap: 7 (ref 7–16)
Calcium, Total: 8.8 mg/dL (ref 8.5–10.1)
Co2: 24 mmol/L (ref 21–32)
EGFR (African American): >60
EGFR (Non-African Amer.): >60
Glucose: 125 mg/dL — ABNORMAL HIGH (ref 65–99)
Osmolality: 278 (ref 275–301)
Sodium: 138 mmol/L (ref 136–145)

## 2012-10-08 LAB — PROTIME-INR
INR: 2
Prothrombin Time: 22.6 secs — ABNORMAL HIGH (ref 11.5–14.7)

## 2012-10-08 LAB — CBC WITH DIFFERENTIAL/PLATELET
Basophil #: 0 10*3/uL (ref 0.0–0.1)
Eosinophil #: 0 10*3/uL (ref 0.0–0.7)
Eosinophil %: 0.1 %
HCT: 20.2 % — ABNORMAL LOW (ref 35.0–47.0)
MCH: 30.8 pg (ref 26.0–34.0)
MCHC: 33.6 g/dL (ref 32.0–36.0)
MCV: 92 fL (ref 80–100)
Neutrophil #: 2.5 10*3/uL (ref 1.4–6.5)
Platelet: 172 10*3/uL (ref 150–440)
RBC: 2.21 10*6/uL — ABNORMAL LOW (ref 3.80–5.20)
RDW: 24.8 % — ABNORMAL HIGH (ref 11.5–14.5)

## 2012-10-08 LAB — BASIC METABOLIC PANEL
BUN: 12 mg/dL (ref 7–18)
Calcium, Total: 8.7 mg/dL (ref 8.5–10.1)
Creatinine: 0.7 mg/dL (ref 0.60–1.30)
EGFR (African American): >60
EGFR (Non-African Amer.): >60
Glucose: 108 mg/dL — ABNORMAL HIGH (ref 65–99)
Osmolality: 285 (ref 275–301)
Potassium: 3.3 mmol/L — ABNORMAL LOW (ref 3.5–5.1)

## 2012-10-09 LAB — CBC WITH DIFFERENTIAL/PLATELET
Basophil #: 0 10*3/uL (ref 0.0–0.1)
HGB: 7.3 g/dL — ABNORMAL LOW (ref 12.0–16.0)
Lymphocyte #: 0.4 10*3/uL — ABNORMAL LOW (ref 1.0–3.6)
Lymphocyte %: 12.5 %
MCHC: 33.7 g/dL (ref 32.0–36.0)
MCV: 92 fL (ref 80–100)
Monocyte %: 7.9 %
Neutrophil %: 78.5 %
Platelet: 161 10*3/uL (ref 150–440)
RBC: 2.37 10*6/uL — ABNORMAL LOW (ref 3.80–5.20)
RDW: 22.8 % — ABNORMAL HIGH (ref 11.5–14.5)
WBC: 2.9 10*3/uL — ABNORMAL LOW (ref 3.6–11.0)

## 2012-10-09 LAB — PROTIME-INR: INR: 2.4

## 2012-10-09 LAB — BASIC METABOLIC PANEL
Anion Gap: 6 — ABNORMAL LOW (ref 7–16)
BUN: 8 mg/dL (ref 7–18)
Calcium, Total: 8.3 mg/dL — ABNORMAL LOW (ref 8.5–10.1)
Chloride: 114 mmol/L — ABNORMAL HIGH (ref 98–107)
EGFR (Non-African Amer.): >60
Osmolality: 283 (ref 275–301)
Potassium: 3.6 mmol/L (ref 3.5–5.1)
Sodium: 143 mmol/L (ref 136–145)

## 2012-10-10 LAB — MAGNESIUM: Magnesium: 1.7 mg/dL — ABNORMAL LOW

## 2012-10-10 LAB — CBC WITH DIFFERENTIAL/PLATELET
Basophil %: 0.6 %
Eosinophil #: 0 10*3/uL (ref 0.0–0.7)
Eosinophil %: 1 %
HGB: 7.4 g/dL — ABNORMAL LOW (ref 12.0–16.0)
Lymphocyte #: 0.3 10*3/uL — ABNORMAL LOW (ref 1.0–3.6)
Lymphocyte %: 17 %
MCH: 30.4 pg (ref 26.0–34.0)
MCV: 92 fL (ref 80–100)
Monocyte %: 5.8 %
Neutrophil #: 1.4 10*3/uL (ref 1.4–6.5)
Neutrophil %: 75.6 %
WBC: 1.9 10*3/uL — CL (ref 3.6–11.0)

## 2012-10-10 LAB — BASIC METABOLIC PANEL
Calcium, Total: 8.3 mg/dL — ABNORMAL LOW (ref 8.5–10.1)
Chloride: 110 mmol/L — ABNORMAL HIGH (ref 98–107)
Co2: 26 mmol/L (ref 21–32)
Creatinine: 0.59 mg/dL — ABNORMAL LOW (ref 0.60–1.30)
EGFR (Non-African Amer.): >60
Glucose: 92 mg/dL (ref 65–99)
Osmolality: 276 (ref 275–301)
Potassium: 3.5 mmol/L (ref 3.5–5.1)
Sodium: 140 mmol/L (ref 136–145)

## 2012-10-10 LAB — PROTIME-INR: INR: 2.5

## 2012-10-11 LAB — CBC WITH DIFFERENTIAL/PLATELET
Basophil #: 0 10*3/uL (ref 0.0–0.1)
Eosinophil #: 0 10*3/uL (ref 0.0–0.7)
HCT: 22 % — ABNORMAL LOW (ref 35.0–47.0)
Lymphocyte #: 0.3 10*3/uL — ABNORMAL LOW (ref 1.0–3.6)
MCHC: 33.5 g/dL (ref 32.0–36.0)
MCV: 91 fL (ref 80–100)
Monocyte #: 0.2 x10 3/mm (ref 0.2–0.9)
Neutrophil #: 0.9 10*3/uL — ABNORMAL LOW (ref 1.4–6.5)
RDW: 22.1 % — ABNORMAL HIGH (ref 11.5–14.5)
WBC: 1.4 10*3/uL — CL (ref 3.6–11.0)

## 2012-10-11 LAB — BASIC METABOLIC PANEL
Anion Gap: 6 — ABNORMAL LOW (ref 7–16)
Calcium, Total: 8.2 mg/dL — ABNORMAL LOW (ref 8.5–10.1)
Chloride: 106 mmol/L (ref 98–107)
Co2: 26 mmol/L (ref 21–32)
EGFR (African American): >60
EGFR (Non-African Amer.): >60
Glucose: 99 mg/dL (ref 65–99)
Osmolality: 272 (ref 275–301)
Sodium: 138 mmol/L (ref 136–145)

## 2012-10-12 LAB — BASIC METABOLIC PANEL
Anion Gap: 5 — ABNORMAL LOW (ref 7–16)
Calcium, Total: 8.3 mg/dL — ABNORMAL LOW (ref 8.5–10.1)
Creatinine: 0.61 mg/dL (ref 0.60–1.30)
EGFR (African American): >60
EGFR (Non-African Amer.): >60
Glucose: 88 mg/dL (ref 65–99)
Osmolality: 275 (ref 275–301)

## 2012-10-12 LAB — CBC WITH DIFFERENTIAL/PLATELET
Basophil #: 0 10*3/uL (ref 0.0–0.1)
Eosinophil %: 0.8 %
HCT: 21.9 % — ABNORMAL LOW (ref 35.0–47.0)
Lymphocyte #: 0.3 10*3/uL — ABNORMAL LOW (ref 1.0–3.6)
Lymphocyte %: 18.4 %
MCHC: 34.3 g/dL (ref 32.0–36.0)
MCV: 92 fL (ref 80–100)
Monocyte %: 13.2 %
Neutrophil #: 0.9 10*3/uL — ABNORMAL LOW (ref 1.4–6.5)
Neutrophil %: 67.2 %
Platelet: 111 10*3/uL — ABNORMAL LOW (ref 150–440)
RDW: 21.8 % — ABNORMAL HIGH (ref 11.5–14.5)

## 2012-10-12 LAB — PROTIME-INR
INR: 2.7
Prothrombin Time: 28.7 secs — ABNORMAL HIGH (ref 11.5–14.7)

## 2012-10-12 LAB — MAGNESIUM: Magnesium: 1.7 mg/dL — ABNORMAL LOW

## 2012-10-13 LAB — CBC CANCER CENTER
Basophil %: 0.6 %
Eosinophil #: 0 x10 3/mm (ref 0.0–0.7)
Eosinophil %: 0.9 %
HCT: 22.9 % — ABNORMAL LOW (ref 35.0–47.0)
HGB: 7.9 g/dL — ABNORMAL LOW (ref 12.0–16.0)
MCH: 31.5 pg (ref 26.0–34.0)
MCHC: 34.4 g/dL (ref 32.0–36.0)
MCV: 91 fL (ref 80–100)
Monocyte #: 0.2 x10 3/mm (ref 0.2–0.9)
Neutrophil #: 1.1 x10 3/mm — ABNORMAL LOW (ref 1.4–6.5)
Neutrophil %: 70.6 %
RBC: 2.51 10*6/uL — ABNORMAL LOW (ref 3.80–5.20)

## 2012-10-18 ENCOUNTER — Ambulatory Visit: Payer: Self-pay | Admitting: Oncology

## 2012-10-20 LAB — CBC CANCER CENTER
Basophil #: 0 x10 3/mm (ref 0.0–0.1)
Eosinophil %: 1.7 %
Lymphocyte #: 0.8 x10 3/mm — ABNORMAL LOW (ref 1.0–3.6)
MCV: 92 fL (ref 80–100)
Monocyte #: 0.3 x10 3/mm (ref 0.2–0.9)
Monocyte %: 9.8 %
Platelet: 109 x10 3/mm — ABNORMAL LOW (ref 150–440)
RDW: 21.9 % — ABNORMAL HIGH (ref 11.5–14.5)
WBC: 3.5 x10 3/mm — ABNORMAL LOW (ref 3.6–11.0)

## 2012-10-20 LAB — PROTIME-INR
INR: 1.5
Prothrombin Time: 18.1 secs — ABNORMAL HIGH (ref 11.5–14.7)

## 2012-10-20 LAB — MAGNESIUM: Magnesium: 1.7 mg/dL — ABNORMAL LOW

## 2012-10-26 ENCOUNTER — Emergency Department: Payer: Self-pay | Admitting: Emergency Medicine

## 2012-10-26 LAB — COMPREHENSIVE METABOLIC PANEL
Albumin: 2.5 g/dL — ABNORMAL LOW (ref 3.4–5.0)
Alkaline Phosphatase: 143 U/L — ABNORMAL HIGH (ref 50–136)
BUN: 6 mg/dL — ABNORMAL LOW (ref 7–18)
Bilirubin,Total: 0.4 mg/dL (ref 0.2–1.0)
Creatinine: 0.6 mg/dL (ref 0.60–1.30)
EGFR (African American): >60
EGFR (Non-African Amer.): >60
Glucose: 105 mg/dL — ABNORMAL HIGH (ref 65–99)
SGOT(AST): 22 U/L (ref 15–37)
SGPT (ALT): 9 U/L — ABNORMAL LOW (ref 12–78)
Total Protein: 8.4 g/dL — ABNORMAL HIGH (ref 6.4–8.2)

## 2012-10-26 LAB — URINALYSIS, COMPLETE
Bilirubin,UR: NEGATIVE
Glucose,UR: NEGATIVE mg/dL (ref 0–75)
Hyaline Cast: 14
Ketone: NEGATIVE
Ph: 5 (ref 4.5–8.0)
Protein: 30
RBC,UR: 5 /HPF (ref 0–5)
Specific Gravity: 1.02 (ref 1.003–1.030)
Squamous Epithelial: 3

## 2012-10-26 LAB — CBC
HGB: 8.3 g/dL — ABNORMAL LOW (ref 12.0–16.0)
MCH: 30.4 pg (ref 26.0–34.0)
MCHC: 32.3 g/dL (ref 32.0–36.0)
Platelet: 264 10*3/uL (ref 150–440)
RBC: 2.73 10*6/uL — ABNORMAL LOW (ref 3.80–5.20)
RDW: 22.9 % — ABNORMAL HIGH (ref 11.5–14.5)
WBC: 8.3 10*3/uL (ref 3.6–11.0)

## 2012-10-26 LAB — MAGNESIUM: Magnesium: 1.8 mg/dL

## 2012-10-26 LAB — LIPASE, BLOOD: Lipase: 147 U/L (ref 73–393)

## 2012-10-27 LAB — CBC CANCER CENTER
Basophil #: 0 x10 3/mm (ref 0.0–0.1)
Basophil %: 0.4 %
Eosinophil #: 0 x10 3/mm (ref 0.0–0.7)
Eosinophil %: 0.5 %
HCT: 25.1 % — ABNORMAL LOW (ref 35.0–47.0)
HGB: 8.1 g/dL — ABNORMAL LOW (ref 12.0–16.0)
Lymphocyte #: 0.9 x10 3/mm — ABNORMAL LOW (ref 1.0–3.6)
Lymphocyte %: 10.7 %
Monocyte #: 0.9 x10 3/mm (ref 0.2–0.9)
Monocyte %: 11.4 %
Neutrophil #: 6.2 x10 3/mm (ref 1.4–6.5)
RDW: 23.7 % — ABNORMAL HIGH (ref 11.5–14.5)
WBC: 8.1 x10 3/mm (ref 3.6–11.0)

## 2012-11-02 LAB — URINALYSIS, COMPLETE
Bilirubin,UR: NEGATIVE
Glucose,UR: NEGATIVE mg/dL (ref 0–75)
Protein: NEGATIVE
RBC,UR: 2 /HPF (ref 0–5)
Specific Gravity: >1.06 (ref 1.003–1.030)
Squamous Epithelial: 1
WBC UR: 9 /HPF (ref 0–5)

## 2012-11-02 LAB — COMPREHENSIVE METABOLIC PANEL
Albumin: 2.5 g/dL — ABNORMAL LOW (ref 3.4–5.0)
Anion Gap: 8 (ref 7–16)
BUN: 20 mg/dL — ABNORMAL HIGH (ref 7–18)
Bilirubin,Total: 0.4 mg/dL (ref 0.2–1.0)
Calcium, Total: 8.8 mg/dL (ref 8.5–10.1)
Chloride: 109 mmol/L — ABNORMAL HIGH (ref 98–107)
EGFR (African American): >60
Glucose: 85 mg/dL (ref 65–99)
SGOT(AST): 28 U/L (ref 15–37)
SGPT (ALT): 9 U/L — ABNORMAL LOW (ref 12–78)
Total Protein: 7.2 g/dL (ref 6.4–8.2)

## 2012-11-02 LAB — CBC
MCH: 30.8 pg (ref 26.0–34.0)
MCV: 95 fL (ref 80–100)
Platelet: 136 10*3/uL — ABNORMAL LOW (ref 150–440)
RBC: 2.01 10*6/uL — ABNORMAL LOW (ref 3.80–5.20)
RDW: 22.8 % — ABNORMAL HIGH (ref 11.5–14.5)

## 2012-11-02 LAB — IRON AND TIBC
Iron Bind.Cap.(Total): 162 ug/dL — ABNORMAL LOW (ref 250–450)
Iron Saturation: 11 %
Unbound Iron-Bind.Cap.: 144 ug/dL

## 2012-11-02 LAB — PROTIME-INR
INR: 1.6
Prothrombin Time: 19.5 secs — ABNORMAL HIGH (ref 11.5–14.7)

## 2012-11-02 LAB — MAGNESIUM: Magnesium: 1.9 mg/dL

## 2012-11-03 ENCOUNTER — Inpatient Hospital Stay: Payer: Self-pay | Admitting: Internal Medicine

## 2012-11-03 LAB — CBC WITH DIFFERENTIAL/PLATELET
Basophil %: 0.3 %
Eosinophil %: 0.5 %
HCT: 19.4 % — ABNORMAL LOW (ref 35.0–47.0)
HGB: 6 g/dL — ABNORMAL LOW (ref 12.0–16.0)
Lymphocyte #: 0.4 10*3/uL — ABNORMAL LOW (ref 1.0–3.6)
Lymphocyte %: 10.7 %
MCH: 29.7 pg (ref 26.0–34.0)
MCHC: 31.1 g/dL — ABNORMAL LOW (ref 32.0–36.0)
Monocyte #: 0.4 x10 3/mm (ref 0.2–0.9)
Monocyte %: 10.1 %
Neutrophil #: 3.1 10*3/uL (ref 1.4–6.5)
RDW: 22.8 % — ABNORMAL HIGH (ref 11.5–14.5)
WBC: 4 10*3/uL (ref 3.6–11.0)

## 2012-11-03 LAB — COMPREHENSIVE METABOLIC PANEL
Alkaline Phosphatase: 92 U/L (ref 50–136)
Anion Gap: 8 (ref 7–16)
Bilirubin,Total: 0.4 mg/dL (ref 0.2–1.0)
Chloride: 111 mmol/L — ABNORMAL HIGH (ref 98–107)
EGFR (African American): >60
Glucose: 78 mg/dL (ref 65–99)
Potassium: 3.7 mmol/L (ref 3.5–5.1)
SGPT (ALT): 9 U/L — ABNORMAL LOW (ref 12–78)
Sodium: 141 mmol/L (ref 136–145)
Total Protein: 6.9 g/dL (ref 6.4–8.2)

## 2012-11-03 LAB — TROPONIN I
Troponin-I: <0.02 ng/mL
Troponin-I: <0.02 ng/mL

## 2012-11-18 ENCOUNTER — Ambulatory Visit: Payer: Self-pay | Admitting: Oncology

## 2012-12-19 DEATH — deceased

## 2013-07-12 IMAGING — CT CT ANGIO CHEST
1 of 4 series · 18 of 30 positions shown · IV contrast ([ID] OMNI 350)
Comparison: None.

CLINICAL DATA: Markedly elevated D-dimer; recently diagnosed left
lower leg DVT.  Shortness of breath.

CT ANGIOGRAPHY CHEST WITH CONTRAST
TECHNIQUE: Multidetector CT imaging of the chest was performed
using the standard protocol during bolus administration of
intravenous contrast.  Multiplanar CT image reconstructions
including MIPs were obtained to evaluate the vascular anatomy.
Contrast:  100 mL of Omnipaque 350 IV contrast

[Series 9: thins for pacs · axial · 0.67mm/px · z∈[-267,-50]mm · 18 of 245 slices shown]
[im 14/245  lung]
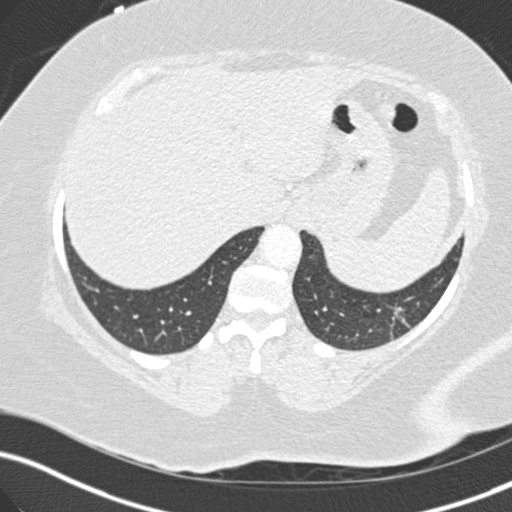
[im 28/245  mediastinal]
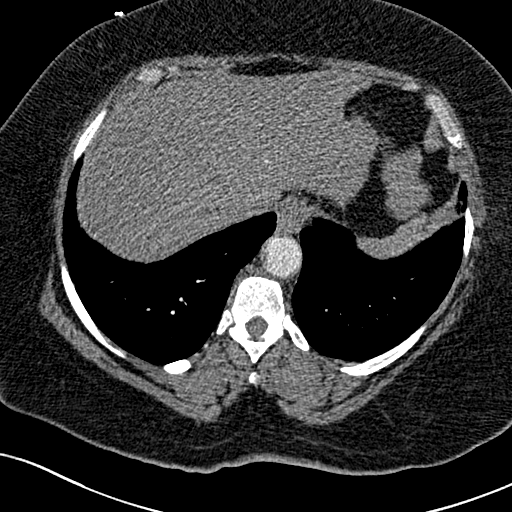
[im 41/245  lung]
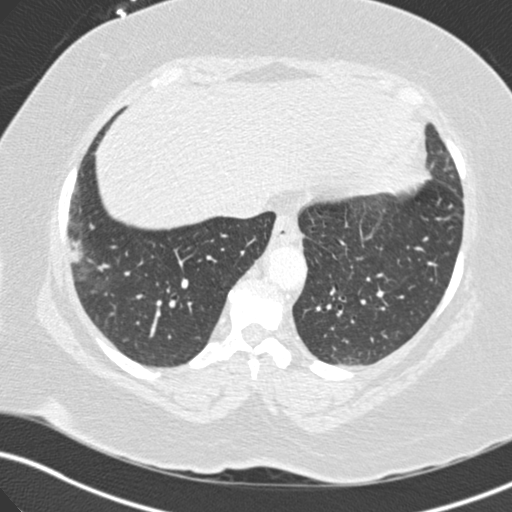
[im 55/245  mediastinal]
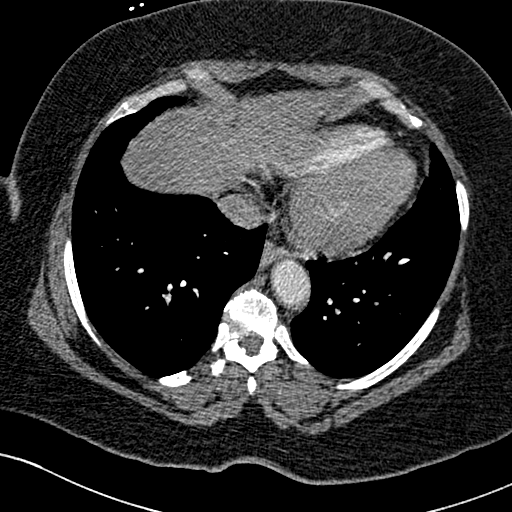
[im 68/245  lung]
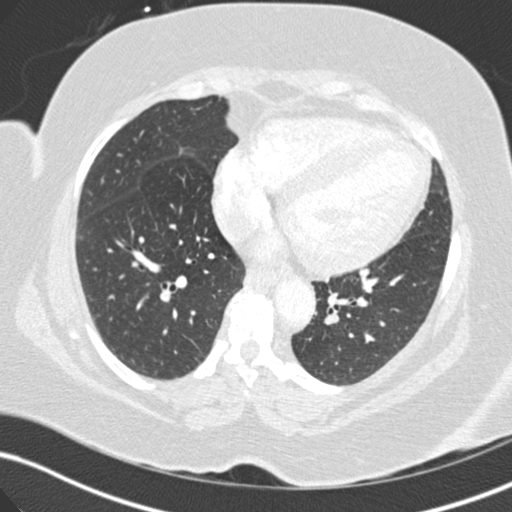
[im 82/245  mediastinal]
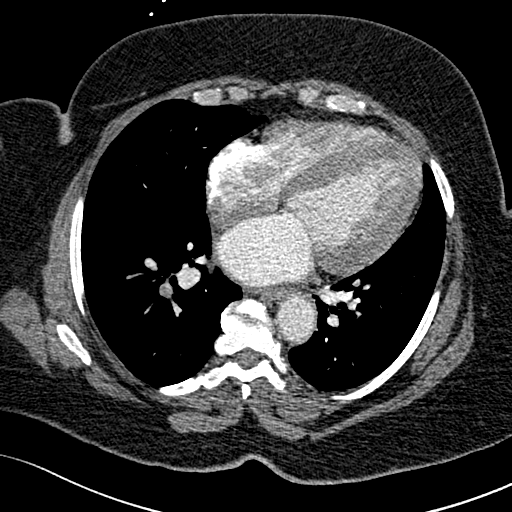
[im 95/245  lung]
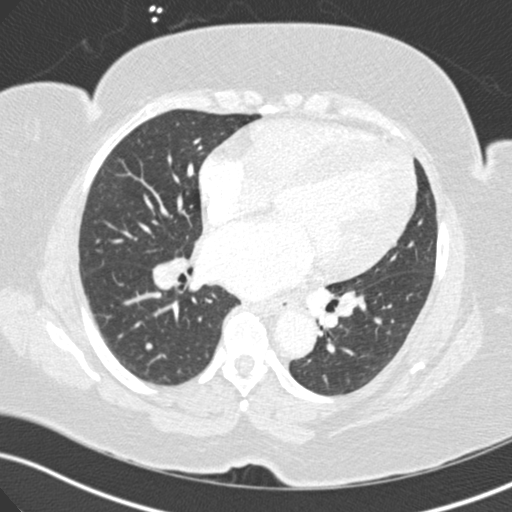
[im 103/245  mediastinal]
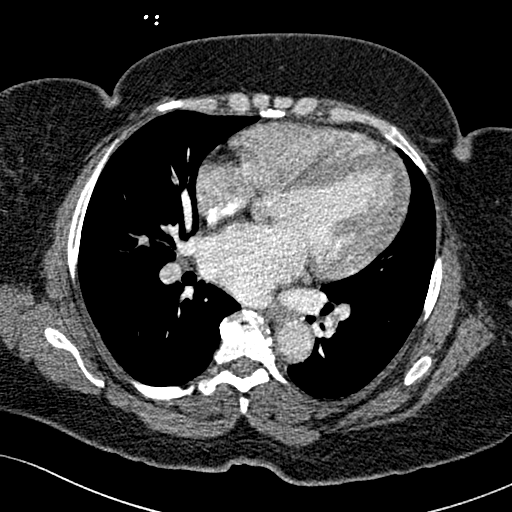
[im 109/245  lung]
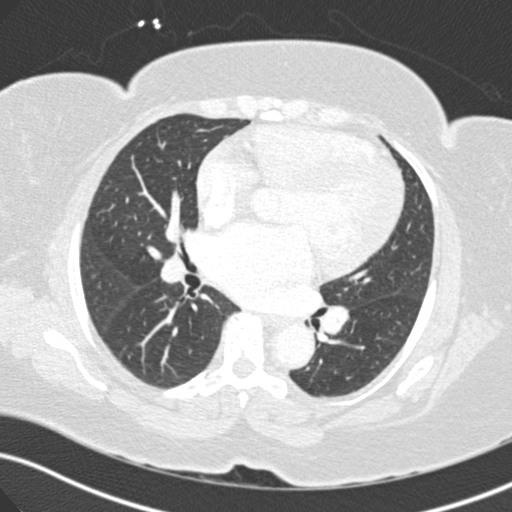
[im 123/245  mediastinal]
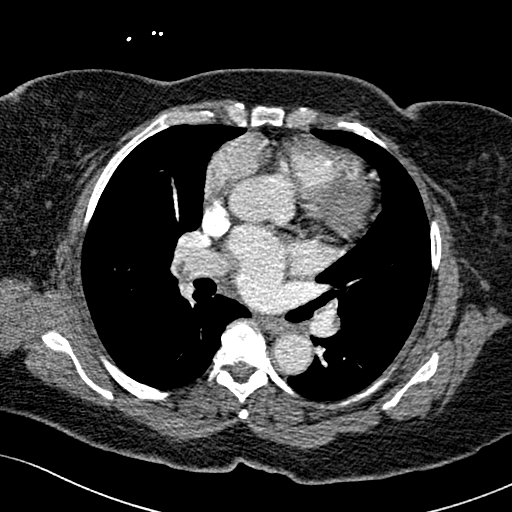
[im 136/245  lung]
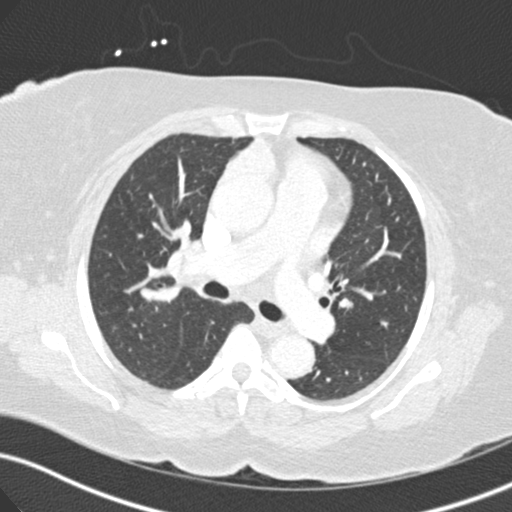
[im 150/245  mediastinal]
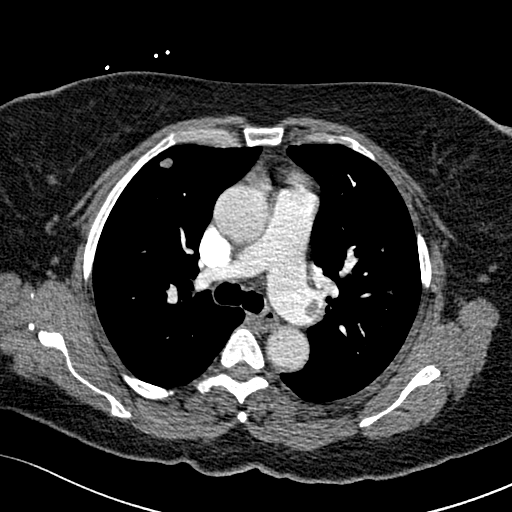
[im 163/245  lung]
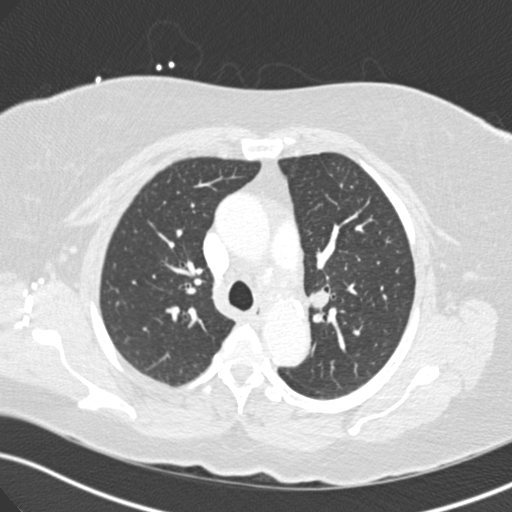
[im 177/245  mediastinal]
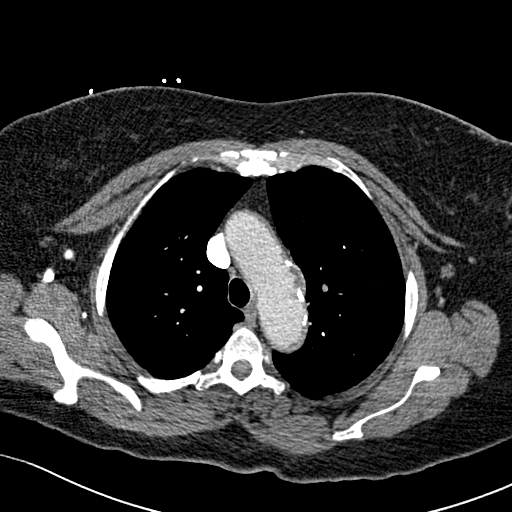
[im 190/245  lung]
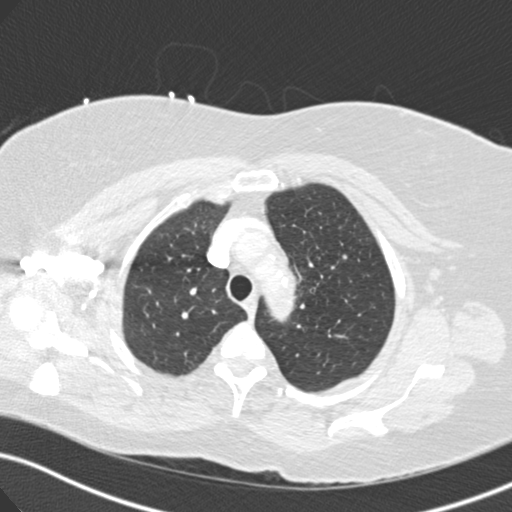
[im 204/245  mediastinal]
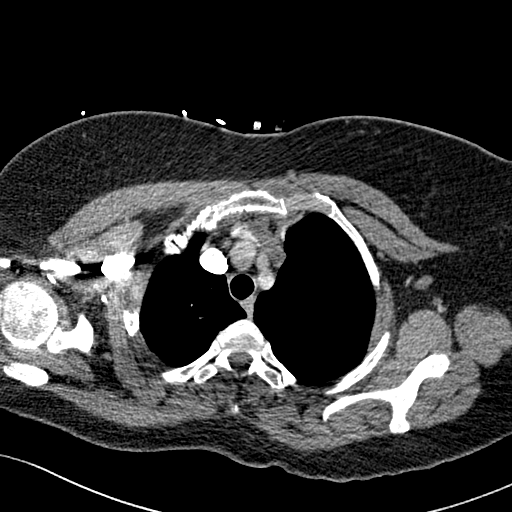
[im 217/245  lung]
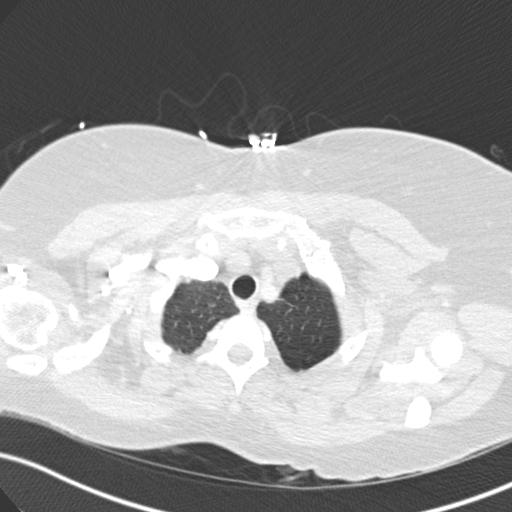
[im 231/245  mediastinal]
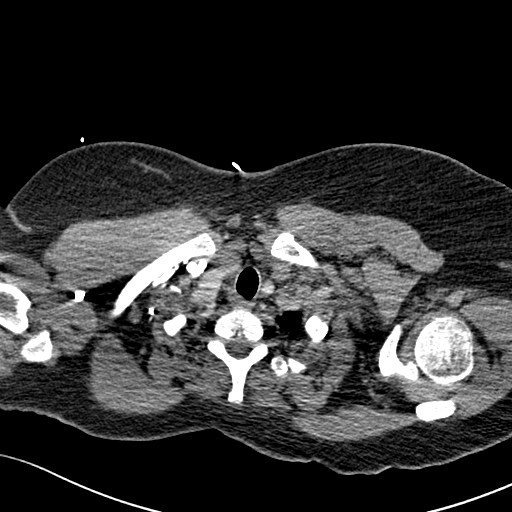

[18 of 30 positions shown; findings below may reference images not displayed]

FINDINGS: There is pulmonary embolus within both main pulmonary
arteries, extending distally into segmental and subsegmental
branches of the the right middle lobe, right lower lobe, left upper
lobe and left lower lobe.

A small focus of hazy opacity at the right lung base likely
reflects a small pulmonary infarct.  Multiple pulmonary nodules are
identified within the lungs; the largest measures 1.3 cm in size
(image 32 of 82) in the anterior right upper lobe, while a second
nodule measures 9 mm (image 53 of 82) within the left lower lobe,
and scattered smaller nodules within both lungs measure up to 4 mm
in size.

There is no evidence of pleural effusion or pneumothorax.

Scattered small right paratracheal nodes remain normal in size;
there is no evidence of mediastinal lymphadenopathy.  Scattered
calcification is noted along the aortic arch.  The great vessels
are grossly unremarkable in appearance.  No pericardial effusion is
seen.  No axillary lymphadenopathy is seen.  The visualized
portions of the thyroid gland are unremarkable in appearance.

The visualized portions of the liver and spleen are unremarkable.

No acute osseous abnormalities are seen.

Review of the MIP images confirms the above findings.
IMPRESSION: 1.  Pulmonary embolus noted within both main pulmonary arteries,
extending distally into segmental and subsegmental branches of the
right middle lobe, right lower lobe, left upper lobe and left lower
lobe.
2.  Small pulmonary infarct noted at the right lung base.
3.  Multiple pulmonary nodules identified within both lungs,
measuring up to 1.3 cm in size.  These may reflect either prior
infection or metastatic disease to the lungs; clinical correlation
and further evaluation are recommended.

4.  No evidence of mediastinal lymphadenopathy.

Critical Value/emergent results were called by telephone at the
time of interpretation on 07/31/2011  at [DATE] a.m.  to  Dr. Javanesians
Grairi, who verbally acknowledged these results.

## 2013-09-26 IMAGING — CR DG ABDOMEN 3V
1 series · 4 of 4 positions shown · non-contrast
Comparison: none

REASON FOR EXAM: pain, ovarian cancer.
COMMENTS:

PROCEDURE:     DXR - DXR ABDOMEN COMPLETE  - October 15, 2011  [DATE]
RESULT:     Comparison: None.

[Series 1: w abdomen upright · 0.14mm/px · 4 of 4 slices shown]
[im 1/4]
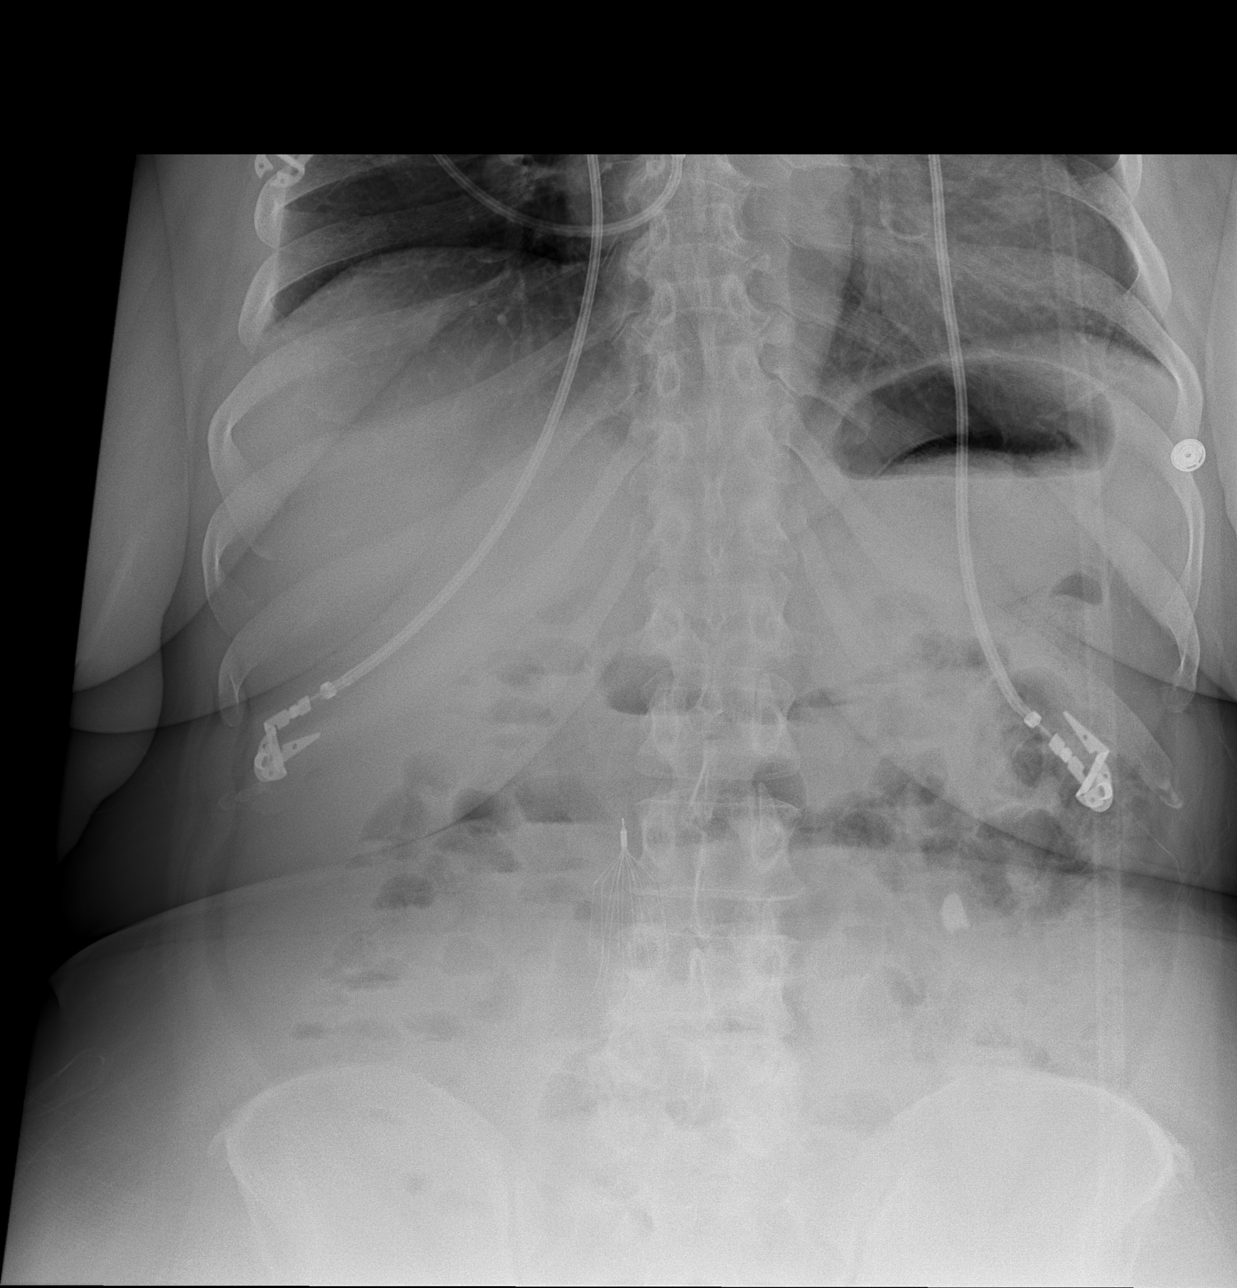
[im 2/4]
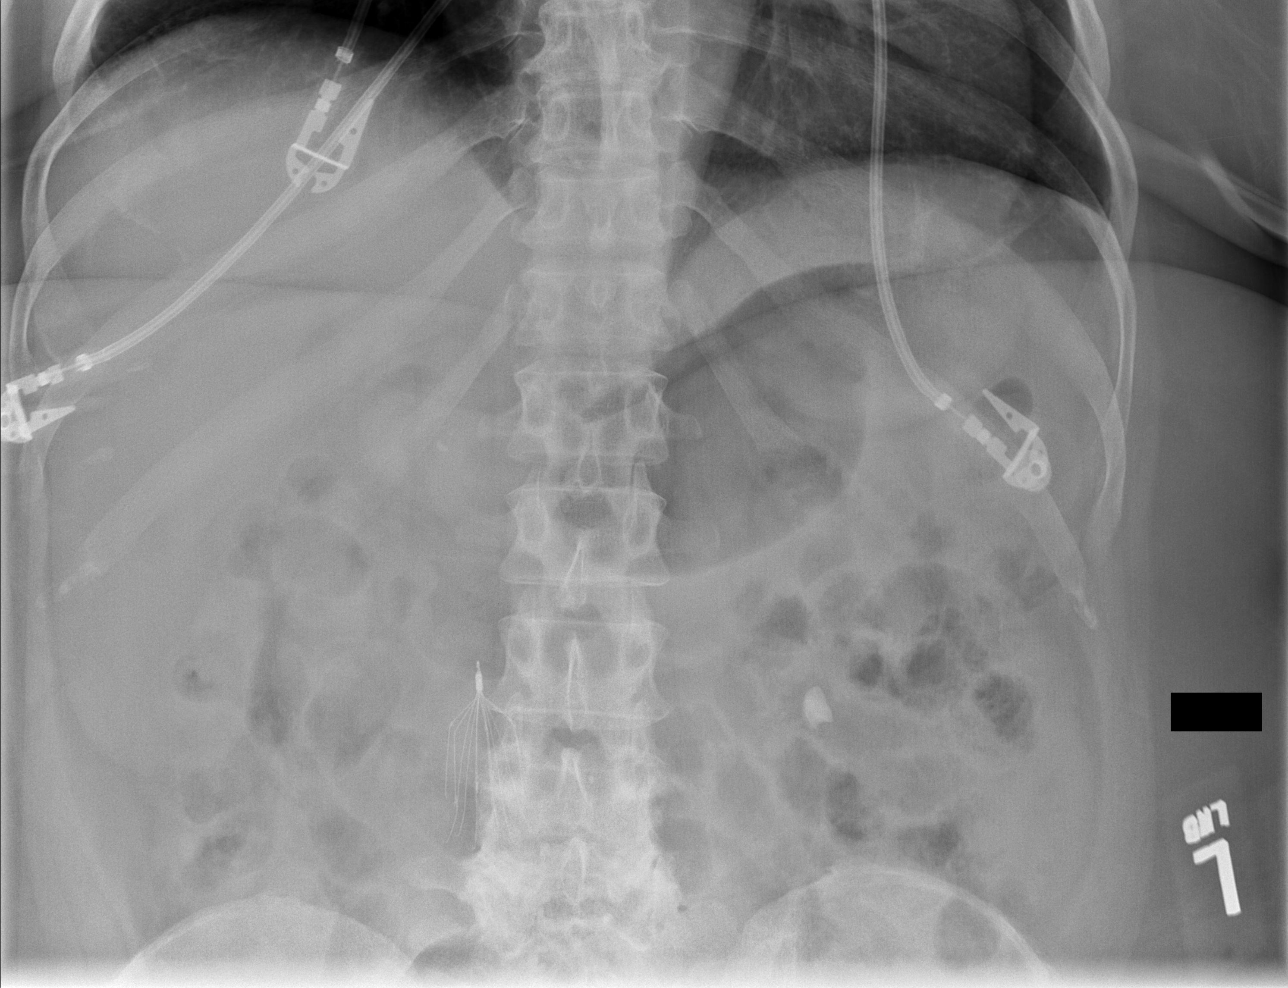
[im 3/4]
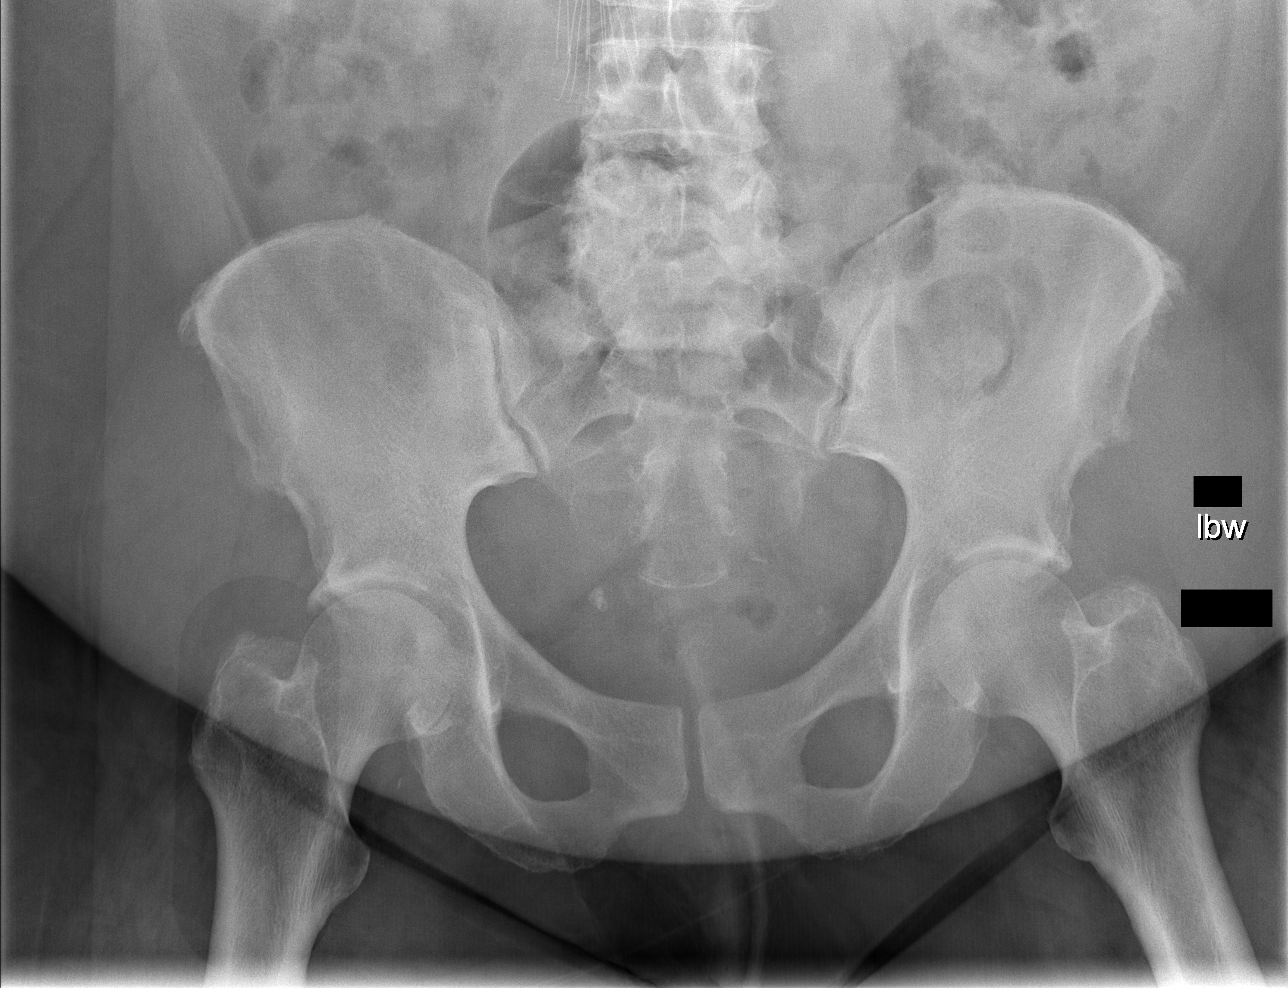
[im 4/4]
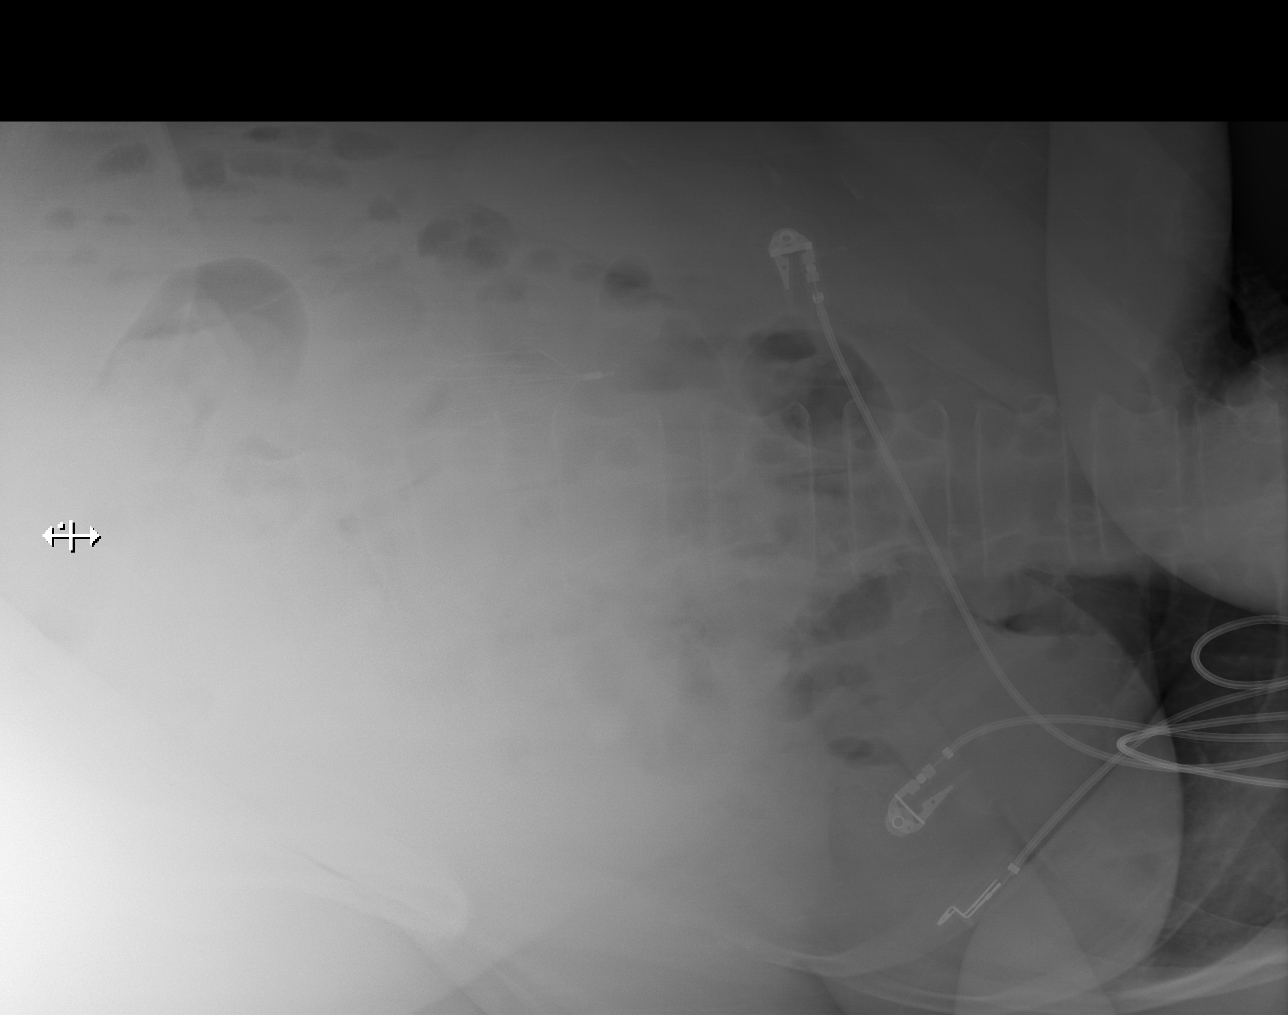

[4 of 4 positions shown; findings below may reference images not displayed]

FINDINGS: No free intraperitoneal air. Air is seen within nondilated small and large
bowel. There is a 1.2 cm left renal calculus. An IVC filter is present.
Calcifications in the pelvis likely represent phleboliths.
IMPRESSION: 1. Nonobstructed bowel gas pattern.
2. Left-sided nephrolithiasis.

## 2014-09-19 ENCOUNTER — Encounter (HOSPITAL_COMMUNITY): Payer: Self-pay | Admitting: Obstetrics & Gynecology

## 2015-03-07 NOTE — Discharge Summary (Signed)
PATIENT NAME:  Amber Jordan, Amber Jordan MR#:  [id number] DATE OF BIRTH:  [birth year]  DATE OF ADMISSION:  11/02/2012 DATE OF DISCHARGE:  11/03/2012  DISPOSITION: To Hospice Home.   CONSULTATIONS:  1. Palliative Care. 2. Hospice screening with Jeanie.  CODE STATUS: DO NOT RESUSCITATE.   DISCHARGE MEDICATIONS: 1. Morphine PCA 2 mg per hour. 4. Ativan as needed for anxiety.   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 The patient had leg pain. The patient was admitted to hospitalist service.  The patient's lab data showed CBC and BMP are within normal limits. Troponins were negative. UA showed 1+ bacteria, but WBC was only 9. CT of the abdomen was done which showed abnormal appearance of the uterus. Femur x-ray showed no fracture of the right femur.   She was admitted to [facility] for anemia, weakness, fall and failure to thrive. The patient's hemoglobin was 6.2 and hematocrit of 19 and INR on admission 1.6. The patient's anemia was thought to be secondary to radiation and chemotherapy-induced. The patient was seen by palliative care and family signed the DNR/DNI. The patient was screened for hospice. The patient was started on morphine PCA. The patient was on fentanyl patch 100 mcg every 72 hours, but that is not helping her so Dr. Harvie JuniorPhifer ordered morphine pump. The patient has very poor prognosis. The patient seems to be accepting that she is approaching end-of-life, so the patient is in agreement for DNR and Hospice Home. She will be going to the Hospice Home.   TIME SPENT ON DISCHARGE PREPARATION: More than 30 minutes. ____________________________ Amber HammingSnehalatha Corde Antonini, MD sk:sb D: 11/03/2012 13:46:14 ET T: 11/04/2012 07:58:58  ET JOB#: [id number]  cc: Amber HammingSnehalatha Genessa Beman, MD, <Dictator> Amber HammingSNEHALATHA Eliah Marquard MD ELECTRONICALLY SIGNED 11/08/2012 13:19

## 2015-03-07 NOTE — Op Note (Signed)
PATIENT NAME:  Amber Jordan, Amber Jordan MR#:  [id number] DATE OF BIRTH:  [birth year]  DATE OF PROCEDURE:  07/27/2012  PREOPERATIVE DIAGNOSIS: Severe dysphagia.   POSTOPERATIVE DIAGNOSIS: Severe dysphasia, likely esophageal ulcer.  PROCEDURE: Flexible laryngoscopy.   SURGEON: Cammy CopaPaul H. Eustace Hur, M.D.   ANESTHESIA: Topical.   PROCEDURE: The patient was seen at the bedside. Topical anesthesia was sprayed using 4% Xylocaine mixed with Afrin in both nostrils. The flexible scope was used to visualize the front of the nose. Mucous membranes are very healthy. The septum is relatively straight. Both sides are reasonably open. The scope was passed through the left side into the nasopharynx. The nasopharynx was clear. No sign of drainage or inflammation. Hypopharynx showed a normal epiglottis. The vocal cords are pearly white. There is no redness at the larynx or laryngeal inlet at all. The arytenoids are normal. There is no redness in the post arytenoid area. No sign of any lesions anywhere. There is no evidence for any yeast infection. The mucosa is extremely healthy. It is moist throughout with no sign of problems anywhere in the hypopharynx or larynx. The patient tolerated the procedure well. This was done at the bedside. There were no operative complications.    ____________________________ Cammy CopaPaul H. Jayton Popelka, MD phj:bjt D: 07/27/2012 20:35:10 ET T: 07/28/2012 10:29:18 ET JOB#: [id number]  cc: Cammy CopaPaul H. Reshunda Strider, MD, <Dictator> Cammy CopaPAUL H Nasir Bright MD ELECTRONICALLY SIGNED 08/03/2012 14:07

## 2015-03-07 NOTE — Consult Note (Signed)
PATIENT NAME:  Amber Jordan, Amber Jordan MR#:  [id number] DATE OF BIRTH:  06-03-1951  DATE OF CONSULTATION:  07/27/2012  REFERRING PHYSICIAN:  Altamese Dilling, MD  CONSULTING PHYSICIAN:  Cammy Copa, MD  ONCOLOGIST: Gerarda Fraction, MD  REASON FOR CONSULTATION: Severe dysphagia.   HISTORY OF PRESENT ILLNESS: The patient is a 64 year old African American female with a past medical history of uterine cancer with mets to the lung receiving chemotherapy regularly. She has had radiation previously to her uterus as well. In the last 3 to 4 days she started having difficulty in swallowing her food and getting medications down. She usually takes pills twice a day, does not take any just before bedtime. The pain is worsening such that she is unable to even swallow much in line of liquids. She has not been able to take her pain medications and is having more lower abdominal pain as well. She was admitted to the hospital for IV medications and IV hydration as well as trying to sort out the underlying source of this pain. She has not have any hoarseness, cough or any nasal symptoms, and no evidence of upper respiratory symptoms. She is not coughing anything up at all. It hurts just when she goes to swallow with like a stabbing pain but otherwise does not hurt all the time.   PAST MEDICAL HISTORY:  1. Uterine cancer with mets to the lung, receiving radiation in 2012 and receiving chemotherapy every month; three cycles completed, last one on 07/11/2012.  2. History of deep vein thrombosis in both legs status post IVC filter, taking Coumadin every day. 3. History of hypertension.   PAST SURGICAL HISTORY: None.  CURRENT MEDICATIONS: As noted in the chart and reviewed.   SOCIAL HISTORY: The patient is on disability because of the cancer. She has had some weakness and been in physical therapy. She was a smoker in the past but quit smoking eight years go. She denies drinking alcohol or any illegal drug use.    DRUG ALLERGIES: No known drug allergies.   FAMILY HISTORY: She denies any cancer in the family. Father died of a heart attack at 47.  PHYSICAL EXAMINATION: The patient is awake and alert and very cooperative. She seems to be a good historian. The ear canals are filled up with hard, dry wax on both sides and I cannot see the eardrums. There is no sign of any swelling or inflammation in the ear canals. TMs are intact and benign. The nose looks open anteriorly. Mucous membranes are very healthy. Oropharynx shows her to be edentulous and no oral lesions. Posterior pharynx is clear. There is no sign of any abnormalities here. The neck has no swelling around the jaw. Submandibular glands are normal. The tyroid is in normal position, moves up and down with swallowing. There are no nodes or masses in the neck. There is no skin swelling or redness and I do not feel any soft tissue swelling in the anterior neck. She points to pain midline just above the sternum and I do not feel any abnormalities there.   RESULTS: Flexible laryngoscopy is done and dictated in detail elsewhere. This shows a healthy nose and nasopharynx and no sign of any infection or drainage here at all. Hypopharynx shows normal anatomy of the epiglottis and larynx. Vocal cords are pearly white and move well. The subglottic space is clear. There is no sign of any redness of the arytenoids or any lesions around the arytenoid. Mucosa is extremely healthy and there is  no evidence for yeast infection anywhere in the hypopharynx or larynx area. There is no redness, lesions or masses anywhere in the hypopharynx.   CT scan was obtained and this shows a normal appearing larynx and hypopharynx. There is no sign any growths or swelling in the area. The submandibular and parotid glands were all clear. No cervical adenopathy. Cervical esophagus does not appear to be distended or changed in any way. The thyroid gland appears to be pretty normal.   IMPRESSION  AND RECOMMENDATIONS: The patient has three-day onset of acute pain with swallowing. There is no evidence of any abnormalities at the larynx level or above, no sign of any significant reflux that is causing it. My suspicion is that there has been a pill that has hung up in her throat and caused ulceration, just at the cervical esophagus or a little bit below it and that this is causing her pain each time she swallows. This could be coated some with a Carafate slurry, but it likely will heal on its own over the next one week. A barium swallow may show evidence of an ulceration and verify the diagnosis or an upper GI can be done to look at the esophagus and see if this can be verified. It is extremely unlikely that yeast overgrowth would happen in the esophagus without having any evidence of it anywhere in her oropharynx. There is no obvious ENT abnormality that can be found.   She did have significant wax in her ear canals that was blocking both sides. This is relatively dry wax and is best cleaned in the office under microscope. Once she is ambulatory and feeling better then she can make an appointment and we will clean her ears and obtain an audiogram at that time.  ____________________________ Cammy CopaPaul H. Vernon Ariel, MD phj:slb D: 07/27/2012 20:44:16 ET T: 07/28/2012 09:50:03 ET JOB#: [id number]  cc: Cammy CopaPaul H. Jakera Beaupre, MD, <Dictator> Cammy CopaPAUL H Naviyah Schaffert MD ELECTRONICALLY SIGNED 08/03/2012 14:07

## 2015-03-07 NOTE — Discharge Summary (Signed)
PATIENT NAME:  Amber Jordan, Amber Jordan MR#:  [id number] DATE OF BIRTH:  February 18, 1951  DATE OF ADMISSION:  07/27/2012 DATE OF DISCHARGE:  07/30/2012  ADMITTING DIAGNOSIS: Dysphagia.   DISCHARGE DIAGNOSES:  1. 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  2. Coagulopathy, status post fresh frozen plasma transfusion.  3. Acute vaginal bleed status post packed red blood cell transfusion.  4. History of uterine cancer with metastasis. 5. Hypertension. 6. Deep vein thrombosis, status post IVC filter placement in the past.   DISCHARGE CONDITION: Stable.   DISCHARGE MEDICATIONS: The patient is to resume her outpatient medications which are:  1. Iron sulfate 325 mg p.o. twice daily. 2. Metoprolol tartrate 25 mg p.o. twice daily. 3. OxyContin 20 mg p.o. every eight hours.  4. Acetaminophen/oxycodone 325 mg/5 mg two tablets every four hours as needed.  5. Xanax 0.25 mg twice daily as needed.  6. Lactulose 15 mL 2 or 3 times a day.  7. Prednisone 50 mg p.o. once on the 07/31/2012, then taper by 10 mg daily until stopped.  8. Morphine 0.25 mg. This will be Roxanol 20 mg in 1 mL oral concentrate, 0.25 mL every four hours as needed which would be 5 mg every four hours as needed.  9. Warfarin 2 mg p.o. daily.  10. Zofran 4 mg p.o. every four hours as needed.  11. Lidocaine topical 2% solution 20 mL p.o. 4 times daily.  12. Sucralfate 1 gram 3 times daily.  13. Omeprazole 40 mg daily.   DIET: Low sodium, low fat, mechanical soft with pureed meats.   ACTIVITY LIMITATIONS: As tolerated.   FOLLOW-UP: Follow-up appointment with Dr. Orlie Dakin in two days after discharge.    CONSULTANT: Dr. Elenore Rota    RADIOLOGIC STUDIES:  1. CT of neck with contrast 07/27/2012 showing abnormal mass in the anterior aspect of the right upper lobe. The patient has known metastatic disease to the lungs. No definite abnormality of the thoracic esophagus or surrounding soft tissues. Oropharynx as well as hypopharynx  exhibit no acute abnormalities.  2. CT of the abdomen and pelvis with contrast 07/27/2012 showed the uterus increased in prominence from the previous study. Fluid appears to be present within the endometrial cavity. Further evaluation with pelvic ultrasound was recommended. Soft tissue fullness in the adnexal regions little changed from previous study. No evidence of ascites. There is no acute bowel abnormality or acute urinary tract abnormality. A stone in the lower pole of the left kidney measuring 1.6 cm in greatest dimension was noted which did not appear new. Stable mild splenomegaly was noted as well as gallbladder appeared to be mildly distended with no evidence of stones. No acute abnormality of liver or pancreas was noted. An enlarged lymph node anterior to the inferior vena cava below the level of the inferior vena caval filter was noted. Node has decreased in size since previous study according to the radiologist.   REASON FOR ADMISSION: The patient is a 64 year old African American female with history of uterine cancer who presented to the hospital with complaints of difficulty swallowing. Please refer to Dr. Larose Hires admission note on 07/27/2012. Apparently the patient had radiation therapy as well as chemotherapy and 3 to 4 days prior to coming to the hospital she started having difficulty swallowing her food. She mostly experienced pain, even liquids were difficulty to swallow. The patient came into the hospital for further evaluation and hospitalist services were contacted for admission.   PHYSICAL EXAMINATION: On arrival to the Emergency Room, the patient's pulse  was 88, respiration rate 18, blood pressure 115/78, saturation was 99% on room air. Temperature was 98.7. Physical exam was unremarkable.   LABORATORY, DIAGNOSTIC, AND RADIOLOGICAL DATA: Lab data on 07/27/2012 showed normal BMP. The patient's liver enzymes showed albumin level of 3.2, otherwise unremarkable. The patient's white  blood cell count was slightly low at 3.5, hemoglobin 8.7, platelet count 184. Coagulation panel showed pro-time of 36.6. INR was 3.7. Urinalysis revealed 1+ ketones, specific gravity 1.015, cloudy urine, negative for glucose or bilirubin, pH 6.0, 1+ blood, 30 mg/dL protein, negative for nitrites, 3+ leukocyte esterase, 5 red blood cells, 97 white blood cells, 2+ bacteria as well as epithelial cells and mucous was present. However, the patient's urine culture showed no growth.   HOSPITAL COURSE: The patient was admitted to the hospital. She was started on antibiotic therapy as well as steroids and Carafate and PPIs. With this therapy she improved. She was evaluated by Dr. Elenore RotaJuengel who proceeded to flexible laryngoscopy on 07/27/2012. Flexible laryngoscopy under topical anesthesia showed mucous membranes were very healthy in the nose. The patient's septum was relatively straight, both sides reasonably opened. Scope was passed through the left side of the nasopharynx and the nasopharynx was found to be clear. No signs of drainage or inflammation were noted. Hypopharynx showed normal epiglottis. Vocal cords were pearly white. No redness of larynx was noted at all. Arytenoids were normal. There was no redness in post arytenoid area and no signs of lesions at all and no evidence of yeast infection. After this the patient was continued on as mentioned above antibiotic therapy as well as therapy with steroids, PPIs, and Carafate. She improved. On day of discharge she was able to tolerate soft diet and was recommended to continue soft diet with pureed meats if she needs to. Her medications were changed to liquid form.   On day of discharge her vital signs are stable with temperature of 97.9, pulse 69, respiration rate 18, blood pressure fluctuating between 150's to 170's and even higher above, oxygen saturation was 94 to 95% on room air at rest.   TIME SPENT: 40 minutes.   ____________________________ Katharina Caperima Sreeja Spies,  MD rv:drc D: 07/30/2012 18:41:01 ET T: 07/31/2012 11:57:01 ET JOB#: [id number]  cc: Katharina Caperima Neddie Steedman, MD, <Dictator> Tollie Pizzaimothy J. Orlie DakinFinnegan, MD Katharina CaperIMA Kimarion Chery MD ELECTRONICALLY SIGNED 08/09/2012 18:26

## 2015-03-07 NOTE — Consult Note (Signed)
History of Present Illness:   Reason for Consult Ovarian cancer on chemotherapy, significant anemia, and unwitnessed fall.    HPI   Patient last seen in clinic on October 27, 2012 when she received topotecan.  Review of systems is difficult given the patient with lethargy.  Patient states she had a fall when she slipped on a rug earlier today.  Patient denies she ever lost consciousness.  Although, She thinks she may have been slightly dizzy and lightheaded prior to falling.  She continues to feel weak and fatigued.  She is also complaining of abdominal pain but states this has improved since admission. She has a fair appetite and continues to lose weight. She has no recent fevers.  She has no neurologic complaints.  She denies any chest pain.  She has no urinary complaints.  Patient offers no further specific complaints.  PFSH:   Additional Past Medical and Surgical History Past medical history: PE, DVT, hypertension.  Past surgical history: Recent D&C, IVC filter.  Family history: Hypertension and diabetes.  Social history: Patient denies tobacco or alcohol.   Review of Systems:   Performance Status (ECOG) 3    Review of Systems   As per HPI. Otherwise, 10 point system review was negative.  NURSING NOTES: **Vital Signs.:   16-Dec-13 17:50    Vital Signs Type: Q 4hr    Temperature Temperature (F): 98    Celsius: 36.6    Temperature Source: Oral    Pulse Pulse: 97    Respirations Respirations: 18    Systolic BP Systolic BP: 025    Diastolic BP (mmHg) Diastolic BP (mmHg): 74    Mean BP: 89    Pulse Ox % Pulse Ox %: 93    Pulse Ox Activity Level: At rest    Oxygen Delivery: Room Air/ 21 %   Physical Exam:   Physical Exam General: Ill-appearing, no acute distress. Eyes: Anicteric sclera. Lungs: Clear to auscultation bilaterally. Heart: Regular rate and rhythm. No rubs, murmurs, or gallops. Abdomen: Soft, nontender, nondistended. No organomegaly noted,  normoactive bowel sounds. Musculoskeletal: No edema, cyanosis, or clubbing. Neuro: Lethargic, but easily arousable. Cranial nerves grossly intact. Skin: No rashes or petechiae noted. Psych: Flat affect.    No Known Allergies:     Coumadin 2 mg oral tablet: 1 tab(s) orally once a day (in the evening) for 3 days (Monday, Tuesday, Wednesday), then increase to 2 tablets every evening from then on, Active, 60, 2   acetaminophen-hydrocodone 325 mg-7.5 mg/15 mL oral solution: 15-30cc every 6 hours as needed for pain, dispense 500 ml. PHARMACIST: patient's prior Rx was stollen. She has metastatic cancer and has chronic pain., Active, 500, None   lactulose 10 g/15 mL oral syrup: 15 milliliter(s) orally 3 times a day, Active, 450, 1   amlodipine:  orally , Active, 0, None   Klor-Con:  orally , Active, 0, None   Tussionex [employer]:  orally , Active, 0, None   acetaminophen-hydrocodone 325 mg-7.5 mg/15 mL oral solution: 15-30 milliliter(s) orally every 6 hours, As Needed- for Pain , Active, 600, None   Xanax 0.25 mg oral tablet: 1 tab(s) orally 3 times a day, As Needed- for Anxiety, Nervousness , Active, 90, 2   ondansetron 8 mg oral tablet: 1 tab(s) orally 2 times a day, As Needed- for Nausea, Vomiting , Active, 60, None   fentanyl 100 mcg/hr transdermal film, extended release: 1 patch transdermal every 48 hours, Active, 15, None  Laboratory Results:  Hepatic:  16-Dec-13  10:01    Bilirubin, Total 0.4   Alkaline Phosphatase 97   SGPT (ALT)  9   SGOT (AST) 28   Total Protein, Serum 7.2   Albumin, Serum  2.5  Routine Chem:  16-Dec-13 10:01    Magnesium, Serum 1.9 (1.8-2.4 THERAPEUTIC RANGE: 4-7 mg/dL TOXIC: > 10 mg/dL  -----------------------)   Iron Binding Capacity (TIBC)  162   Unbound Iron Binding Capacity 144   Iron, Serum  18   Iron Saturation 11 (Result(s) reported on 02 Nov 2012 at 01:38PM.)   Ferritin [facility])  1341 (Result(s) reported on 02 Nov 2012 at 01:47PM.)    Glucose, Serum 85   BUN  20   Creatinine (comp) 0.76   Sodium, Serum 139   Potassium, Serum  3.3   Chloride, Serum  109   CO2, Serum 22   Calcium (Total), Serum 8.8   Osmolality (calc) 279   eGFR (African American) >60   eGFR (Non-African American) >60 (eGFR values <20m/min/1.73 m2 may be an indication of chronic kidney disease (CKD). Calculated eGFR is useful in patients with stable renal function. The eGFR calculation will not be reliable in acutely ill patients when serum creatinine is changing rapidly. It is not useful in  patients on dialysis. The eGFR calculation may not be applicable to patients at the low and high extremes of body sizes, pregnant women, and vegetarians.)   Anion Gap 8  Routine Coag:  16-Dec-13 10:01    Prothrombin  19.5   INR 1.6 (INR reference interval applies to patients on anticoagulant therapy. A single INR therapeutic range for coumarins is not optimal for all indications; however, the suggested range for most indications is 2.0 - 3.0. Exceptions to the INR Reference Range may include: Prosthetic heart valves, acute myocardial infarction, prevention of myocardial infarction, and combinations of aspirin and anticoagulant. The need for a higher or lower target INR must be assessed individually. Reference: The Pharmacology and Management of the Vitamin K  antagonists: the seventh ACCP Conference on Antithrombotic and Thrombolytic Therapy. CJGOTL.5726Sept:126 (3suppl): [id number] A HCT value >55% may artifactually increase the PT.  In one study,  the increase was an average of 25%. Reference:  "Effect on Routine and Special Coagulation Testing Values of Citrate Anticoagulant Adjustment in Patients with High HCT Values." American Journal of Clinical Pathology 2006;126:400-405.)  Routine Hem:  16-Dec-13 10:01    WBC (CBC) 4.5   RBC (CBC)  2.01   Hemoglobin (CBC)  6.2   Hematocrit (CBC)  19.0   Platelet Count (CBC)  136 (Result(s) reported on 02 Nov 2012 at 10:46AM.)   MCV 95   MCH 30.8   MCHC 32.4   RDW  22.8   Assessment and Plan:  Impression:   Progressive stage IV ovarian cancer, PE/DVT, failure to thrive.  Plan:   1.  Ovarian cancer: Patient's last chemotherapy was on October 27, 2012 using topotecan.  Previously patient expressed interest in continuing treatments despite her clot declining performance status, but will delay any further treatment until her acute symptoms resolve.  Appreciate palliative care input.  Anemia: Possibly secondary to recent chemotherapy, agree with blood transfusion and recheck hemoglobin in the a.m. PE/DVT: INR 1.6, continue current dose of Coumadin. Patient's IVC filter remains in secondary to clot. Monitor INR closely while on levaquin Pain: Likely secondary to recurrent disease.  Continue fentanyl patch 100 mcg every 48 hours and Percocet as needed. 5.  UTI: Will initiate patient on Levaquin Failure to thrive: With get  patient's declining performance status, it may not be possible for her to return home and will consider placement upon discharge. consult, will follow.  Electronic Signatures: Delight Hoh (MD)  (Signed 16-Dec-13 19:28)  Authored: HISTORY OF PRESENT ILLNESS, PFSH, ROS, NURSING NOTES, PE, ALLERGIES, HOME MEDICATIONS, LABS, ASSESSMENT AND PLAN   Last Updated: 16-Dec-13 19:28 by Delight Hoh (MD)

## 2015-03-07 NOTE — H&P (Signed)
PATIENT NAME:  Amber Jordan, Amber Jordan MR#:  [id number] DATE OF BIRTH:  04-27-1951  DATE OF ADMISSION:  07/27/2012  REFERRING ER PHYSICIAN: Dr. Mayford Knife  ONCOLOGIST: Dr. Orlie Dakin   PRESENTING COMPLAINT: Difficulty swallowing.   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 For last 3 to 4 days she started having difficulty in swallowing her food and her medication. The problem is worsening to the point that now she feels pain while swallowing even liquids. There are no aggravating or relieving factors. Because of this problem she is not able to take her medications including her pain medication for her cancer and so she started feeling increasing pain in her lower abdomen secondary to her uterine cancer. She called her oncologist office and the nurse suggested her to go to the Emergency Room. In Emergency Room she was found having urinalysis positive for WBCs and some mild swelling in her throat. A CT scan of abdomen and her throat are done and she was given for admission for further management.   REVIEW OF SYSTEMS: CONSTITUTIONAL: She denies any fever or fatigue. She feels weak, generalized. She has pain in her throat and in her abdomen and she lost 120 pounds in last one year. EYES: Denies any blurring or double vision, any pain or redness or discharge from the eye. ENT: Denies any tinnitus, ear pain, hearing loss or discharge from the ears. RESPIRATION denies: Any cough, wheezing or short of breath. CARDIOVASCULAR: Denies any chest pain, orthopnea, edema or palpitation. GASTROINTESTINAL: Denies any nausea, vomiting, or diarrhea. Has abdominal pain in the lower abdomen but denies any hematemesis or melena. Has difficulty in swallowing her fourth and medication. GENITOURINARY: Denies any dysuria or hematuria, but has white vagina discharge. ENDOCRINE: Denies polyuria, nocturia,  or increased sweating or heat or cold intolerance. HEMATOLOGIC: Denies any anemia, easy bruising or bleeding disorder. SKIN: Denies any rash or lesion. MUSCULOSKELETAL: Denies any swelling or pain in the joints. NEUROLOGICAL: Denies any numbness, weakness, vertigo, or loss of balance. PSYCH: Denies any anxiety, depression, or insomnia.   PAST MEDICAL HISTORY:  1. 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 2. 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 3. History of hypertension.   PAST SURGICAL HISTORY: None.   HOME MEDICATIONS:  1. OxyContin 20 mg every eight hours for pain. 2. OxyContin 10 mg every eight over p.r.n. for pain. 3. Percocet 325/5 mg 2 tablets every four hours p.r.n. for pain. 4. Lactulose 15 mL two times a day p.r.n. for constipation.  5. Metoprolol 25 mg 2 times a day. 6. Coumadin 5 mg once a day. 7. Multivitamins 1 tablet daily.   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 Lives alone. Had to go to rehab for physical therapy secondary to weakness and swelling on the legs due to deep vein thrombosis and her cancer. She was a smoker in past. She quit smoking eight years ago. Denies any history of drinking alcohol or any illegal drug use.   ALLERGIES: Denies any medication allergies.   FAMILY HISTORY: Denies any cancer in family. Father died of heart attack at age 33.   PHYSICAL EXAMINATION:  VITAL SIGNS: Pulse rate 88, respirations 18, blood pressure 115/78, temperature 98.7,  pulse oximetry 99 on room air.   GENERAL: Well developed, well nourished, not in any acute distress, slightly obese.   HEENT: Conjunctiva pale. Oral mucosa moist. Slight soft tissue swelling in the throat, does not move with swallowing. No lymph nodes palpable in neck. No supraclavicular lymph node  enlargement.   RESPIRATORY SYSTEM: Bilateral clear and equal air entry.   CARDIOVASCULAR: S1, S2 present, regular, no murmur.   ABDOMEN: Slightly distended, soft, tender mostly in lower part. Bowel sounds present.   EXTREMITIES: No pedal edema. No joint swelling and tenderness.   SKIN: No rash.   NEUROLOGICAL: Cranial nerves grossly intact. No motor or sensory deficit. Follows commands.  PSYCH: Fully alert and oriented to time, place, and person. Good insight. Cooperative to examination.   LABORATORY, DIAGNOSTIC AND RADIOLOGICAL DATA: Glucose 79, BUN 10, creatinine 0.92, sodium 137, potassium 3.6, chloride 100, CO2 25, calcium 9.5, lipase 141, total protein 8.9, albumin 3.2, bilirubin 0.4, alkaline phosphatase 90, SGOT 22, SGPT 13, WBC 3.5, RBC 2.95, hemoglobin 8.7, hematocrit 26.5, platelet count 194, MCV 90, prothrombin time 36.6, INR 3.7. Urinalysis 97 WBCs, cloudy urine. CT scan abdomen: The uterus is increased in prominence since the previous study, fluid appears to be present within the endometrial cavity. Further evaluation with pelvic ultrasound is recommended. There is soft tissue fullness in adnexal region, little change from previous study. There is no evidence of ascites. There is no acute bowel abnormality nor acute urinary tract abnormality. There is a stone in the lower pole of left kidney measuring 1.6 cm in greatest diameter. This does not appear new. There is stable mild splenomegaly. The gallbladder appears mildly distended and no evidence of stone. There is no any acute abnormality of the liver or pancreas. There is an enlargement of lymph node anterior to the inferior vena cava below the left of the inferior vena caval filter. Please note has decreased in size since the previous study. CT of neck with contrast: There is an abnormal mass in the anterior aspect of the right upper lobe. The patient has no known metastatic disease of the lung. I do not see definite abnormality of the  thoracic esophagus or surrounding soft tissues. The oropharynx and hypopharynx exhibit no acute abnormalities. The study is limited, however, without significant contrast within the vasculature due to the earlier CT scan of abdomen and pelvis. No subsequent reinjection.   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      PLAN:  1. Dysphagia. Admit to regular medical surgical floor. IV fluid D5  normal saline. Dietary consult. Clear liquids as tolerated. ENT consult for scope look for any obstruction. Magnesium IV for possible reflux disease.  2. Chronic cancer pain. Morphine IV for pain management.  3. Urinary tract infection. Levaquin IV. Urine culture.  4. Vaginal discharge, history of uterine cancer and radiation. GYN consult.  5. History of deep vein thrombosis status post IVC filter on Coumadin taking 5 mg every day. INR is 3.7 so will hold Coumadin today and will start from tomorrow with 4 mg.  6. Hypertension. Will continue home medication, metoprolol 25 mg 2 times a day.  7. Pancytopenia secondary to chemotherapy. No further work-up at this time. Follow up with oncologist. 8. Generalized weakness and weight  loss secondary to cancer and deep vein thrombosis. She is on physical therapy as outpatient, will continue same after discharge.  9. GI prophylaxis with Nexium IV.  10. I discussed with the patient about her LIVING WILL and health care proxy. She wants to be FULL CODE in case of any adverse event.   TOTAL TIME SPENT: 50 minutes in reviewing the labs, management and explaining to patient about the plan.   ____________________________ Hope PigeonVaibhavkumar G. Elisabeth PigeonVachhani, MD vgv:cms D: 07/27/2012 15:57:33 ET T: 07/27/2012 16:43:07  ET JOB#: [id number]  cc: Hope PigeonVaibhavkumar G. Elisabeth PigeonVachhani, MD, <Dictator> Altamese DillingVAIBHAVKUMAR Hal Norrington MD ELECTRONICALLY SIGNED 08/11/2012 15:26

## 2015-03-10 NOTE — H&P (Signed)
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 Apparently the patient was in her usual health when she felt somewhat presyncopal and dizzy and fell down in the restroom. She hurt her legs and her abdomen. She came to the Emergency Room for further evaluation. In the Emergency Room, she was found to have significant anemia with hemoglobin level of 6. She was also tachycardic and hospitalist services were contacted for admission.   PAST MEDICAL HISTORY: 1. History of stage IV ovarian carcinoma. The patient has been treated with topotecan dose in November 2013. 2. History of intractable pain. 3. Pulmonary embolism. 4. Deep venous thrombosis.  5. Hypertension.  6. Dilation and curettage.  7. IVC filter placement. 8. Admission for dysphagia in September 2013 which was felt to be due to radiation esophagitis.  9. History of coagulopathy requiring fresh frozen plasma transfusion.  10. History of hypertension.  PAST SURGICAL HISTORY: 1. Dilation and curettage. 2. IVC filter placement.   MEDICATIONS: (According to medical records) 1. Acetaminophen/hydrocodone 325/7.5 mg/15 mL solution 15 to 30 mL every 6 hours as needed.  2. Fentanyl 100 mcg q. 48 hours.  3. Coumadin 2 mg p.o. daily. 4. Lactulose 10 grams in 15 mL solution 15 mL 3 times a day.  5. Xanax 0.25 mg 3 times daily as needed.  6. Zofran 8 mg twice daily as needed.  7. She was apparently on amlodipine. It is unclear if she is still using amlodipine. She was also on Klor-Con, unknown dose.  She was also on Tussionex.  ALLERGIES: No known drug allergies.   SOCIAL HISTORY: The patient was working as a Scientist, research (physical sciences); however, she is on disability now and lives alone. She was in rehab for physical therapy secondary to weakness as well as swelling in her lower extremities due to DVT and her cancer. She smoked in the past, quit smoking approximately 8 years ago. No alcohol or illicit drug abuse.   FAMILY HISTORY: No cancer in the family. The patient's father died of heart attack at age of 51.   REVIEW OF SYSTEMS: Positive for feeling weak, using walker as well as cane to walk. She has been losing weight, approximately 150 pounds in one year. She has been coughing as well as having some clear phlegm. Admits to some shortness of breath intermittently, dyspnea on exertion. Admits to some nausea as well as constipation as well as abdominal pains, which seems to be in the lower part of the abdomen, achy, intermittent pain which increases after she takes food, approximately 15 minutes after she eats, but no change with bowel movement. Admits of having some abdominal swelling intermittently as well as lower extremity swelling intermittently as well as lower extremity pains. CONSTITUTIONAL: Otherwise, she denies any fevers or chills. Admits of fatigue and weakness. Denies any weight gain. EYES: In regards to eyes, denies any blurry vision, double vision, glaucoma or cataracts. ENT: Denies any tinnitus, allergies, epistaxis, sinus pain, dentures or difficulty swallowing. RESPIRATORY: Denies any hemoptysis. Admits to COPD. CARDIOVASCULAR: Denies any orthopnea, edema, arrhythmias, palpitations or syncope. GASTROINTESTINAL: Denies  any vomiting, diarrhea, hematemesis, rectal bleeding or change in bowel habits. GENITOURINARY: Denies dysuria,  frequency or incontinence. ENDOCRINOLOGY: Denies any polydipsia, nocturia, thyroid problems, heat or cold intolerance or thirst. HEMATOLOGIC: Denies any anemia, easy  bruising, bleeding or swollen glands. SKIN: Denies any acne, rashes, lesions, or change in moles. MUSCULOSKELETAL: Denies arthritis, cramps, or swelling. NEUROLOGIC: Denies numbness, epilepsy or tremor. PSYCHIATRY: Denies anxiety, insomnia or depression.     PHYSICAL EXAMINATION:  VITAL SIGNS: On arrival to the hospital, temperature was 98.2, pulse 120, respiratory rate 18, blood pressure 114/42 and saturation 96% on room air.   GENERAL: This is a well-developed, well-nourished [place] female, very weak and very slow to react, lying on the stretcher.  HEENT: Pupils are equally round and reactive to light. Extraocular movements intact. No icterus or conjunctivitis. Has normal hearing. No pharyngeal erythema. Mucosa is dry. The patient also has some whitish coating on her tongue.  NECK: No masses. Supple and nontender. Thyroid is not enlarged. No adenopathy. No JVD or carotid bruits bilaterally. Full range of motion.   LUNGS: Clear to auscultation in all fields. No rales, rhonchi or wheezing. No labored inspirations, increased effort, dullness to percussion or overt respiratory distress. Somewhat diminished breath sounds were noted bilaterally.   CARDIOVASCULAR: S1 and S2 appreciated. No murmurs, gallops or rubs noted. Tachycardic. PMI not lateralized. Chest is nontender to palpation. 1+ pedal pulses.   EXTREMITIES: No lower extremity edema, calf tenderness or cyanosis was noted.   ABDOMEN: Soft and tender in lower part of the abdomen, but no rebound or guarding was noted. No hepatosplenomegaly or masses were noted. Rectal is deferred. Bowel sounds are present although diminished.   MUSCLE STRENGTH: Able to move all extremities, however, she has significant difficulty moving her lower extremities due to significant pain whenever she tries to move them. No cyanosis, degenerative joint disease or kyphosis. Gait is not tested.  SKIN: No rashes, lesions, erythema, nodularity or induration.  It was warm and dry to palpation.   LYMPH: No adenopathy in the cervical region.   NEUROLOGICAL: Cranial nerves II through XII grossly intact. Sensory is intact. No dysarthria or aphasia.  PSYCH: The patient is alert and oriented to time, person and place. Cooperative. Memory is somewhat impaired, but no significant confusion, agitation or depression noted.   LABS: BMP showed elevated BUN to 20 with potassium of 3.3, otherwise BMP was unremarkable. The patient's liver enzymes showed albumin level of 2.5, otherwise unremarkable. The patient's white blood cell count was 4.5, hemoglobin was 6.2, platelet count 136 and absolute neutrophil count is not checked. Coagulopathy was noted with pro time level of 19.5. INR 1.6.   EKG: Sinus tachycardia at 180 beats per minute with premature supraventricular complexes, possible left atrial enlargement, LVH and repolarization abnormality with ST-T depressions in anterior lateral leads, unchanged since prior EKG done on 10/26/2012.   RADIOLOGIC STUDIES: Right femur x-ray on 11/02/2012 revealed no osseous injury of right femur.   CT scan of abdomen and pelvis with contrast on 11/02/2012 revealed continued abnormal appearance of uterus. Pelvic ultrasound is recommended. This was not apparently performed at this facility following the previous CT. Cannot exclude possibility of some inflammatory change in the right upper quadrant adjacent to duodenum and pancreatic head region. This could certainly be artifact caused by respiratory motion. There is a mild prominence of common bile duct and the pancreatic head. Correlate clinically and with laboratory data. Inferior vena cava filter is present, not mentioned above. There is a  small, fat-filled umbilical hernia which is unchanged.  A lower pole, large nonobstructive left renal stone was noted as well.   ASSESSMENT AND PLAN:  1. Severe anemia. No bleeding source and the patient is guaiac-negative. The patient is  undergoing chemotherapy and I suspect that it is chemotherapy related. We will give iron source and we will transfuse the patient with 2 units of packed red blood cells. We will follow hemoglobin after transfusion and we will ask Dr. Orlie DakinFinnegan to follow the patient along.  2. Generalized weakness likely due to anemia, also malnutrition and failure to thrive. We will consult physical therapist.  3. Hypokalemia. Supplementing IV. We will get also magnesium level and supple it if needed.  4. Coagulopathy, acquired, due to Coumadin, used for DVT and pulmonary embolism. Guaiac is negative. We will continue Coumadin therapy at this time.  5. History of hypertension. The patient is not hypertensive at this time. We will not initiate any medication.  6. Tachycardia, likely due to severe anemia. We will watch and follow after transfusion.  7. Chronic intractable pain. We will advance pain medications.  We will advance the patient's hydrocodone to 325/10 mg in 15 mL solution or tablet versus MS Contin.  8. Intermittent constipation. We will add senna.  9. Abnormal duodenal appearance. Questionably related to the patient's cancer. We add also Protonix and Carafate.   TIME SPENT: 50 minutes. ____________________________ Katharina Caperima Thomasa Heidler, MD rv:sb D: 11/02/2012 13:02:20 ET T: 11/02/2012 13:47:15 ET JOB#: [id number]  cc: Katharina Caperima Salam Chesterfield, MD, <Dictator> Tollie Pizzaimothy J. Orlie DakinFinnegan, MD Katharina CaperIMA Lillybeth Tal MD ELECTRONICALLY SIGNED 11/18/2012 20:36

## 2015-03-12 NOTE — Op Note (Signed)
PATIENT NAME:  Amber MessingGAYLE, Jentry MR#:  [id number] DATE OF BIRTH:  11/01/1951  DATE OF PROCEDURE:  02/20/2012  PREOPERATIVE DIAGNOSES:  1. Previous deep vein thrombosis and pulmonary embolism status post IVC filter placement.  2. Ovarian cancer.  3. Hypertension.   POSTOPERATIVE DIAGNOSES:  1. Previous deep vein thrombosis and pulmonary embolism status post IVC filter placement.  2. Ovarian cancer.  3. Hypertension.   PROCEDURES:  1. Ultrasound guidance for vascular access, left jugular vein.  2. Catheter placement into inferior vena cava.  3. Inferior venacavogram.   SURGEON: Annice NeedyJason S. Chariti Havel, M.D.   ANESTHESIA: Local with 2 mg Versed.   ESTIMATED BLOOD LOSS: 25 mL.   FLUOROSCOPY TIME: Approximately two minutes.   CONTRAST USED: 15 mL.   INDICATION FOR PROCEDURE: 64 year old [place] female with the above-mentioned issues. She had an IVC filter placed some six months ago for deep venous thrombosis and pulmonary embolus. She has ovarian cancer. She is brought back for planned retrieval of her IVC filter today.  DESCRIPTION OF PROCEDURE: The patient is brought to the vascular interventional radiology suite. Neck was sterilely prepped and draped and a sterile surgical was created.  The left jugular vein was visualized with ultrasound and found to be widely patent. It was then accessed under ultrasound guidance without difficulty with a Seldinger needle and a 3J wire was placed. Permanent image was recorded. We advanced the catheter into the inferior vena cava and inferior venacavogram was then performed. This demonstrated thrombosis of the vena cava within the IVC filter. The patient had been subtherapeutic on her anticoagulation for some time. Due to the presence of thrombosis within the IVC filter, filter retrieval was contraindicated. At this point, I elected to terminate the procedure. The sheath was removed. Pressure was held. Sterile dressing was placed. The patient tolerated the  procedure well and was taken to the recovery room in stable condition.     ____________________________ Annice NeedyJason S. Taralyn Ferraiolo, MD jsd:bjt D: 02/20/2012 10:54:38 ET T: 02/20/2012 11:48:42 ET JOB#: [id number]  cc: Annice NeedyJason S. Rondell Pardon, MD, <Dictator> Tollie Pizzaimothy J. Orlie DakinFinnegan, MD Annice NeedyJASON S Theodis Kinsel MD ELECTRONICALLY SIGNED 02/21/2012 8:10

## 2015-03-12 NOTE — Consult Note (Signed)
Reason for Visit: This 64 year old Female patient presents to the clinic for initial evaluation of  Stage IV endometrial cancer .   Referred by Dr. Hyacinth Meeker.  Diagnosis:   Chief Complaint/Diagnosis   64 year old female completed chemotherapy with good response for widespread metastatic endometrial cancer with retroperitoneal, lung metastasis.   Pathology Report Pathology reviewed    Imaging Report PET/CT scan CT scan reviewed    Referral Report Clinical notes reviewed    Planned Treatment Regimen Palliative radiation therapy to her whole pelvis    HPI   patient is an interesting 64 year old female presented approximately 2 years prior with with postmenopausal bleeding. She underwent a D&C at that timealthough his results not known. Workup with PET CT at that time showed avid pulmonary nodules as well as retroperitoneal adenopathy. 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 Patient has been treated with carboplatinum and Taxol and Avastin for 6 courses with good response. CA 125 levels have normalized. CT scan showed decrease in the adenopathy. Recent endometrial biopsy by Dr. Hyacinth Meeker in April was highly suspicious for endometrial carcinoma. She has been having some intermittent vaginal bleeding and I have asked to see the patient for possibility of palliative radiation therapy. She is having no dominant pain or discomfort at this time. Good bowel function tending towards constipation. No dysuria.  Past Hx:    ovarian cancer:    Renal stone, non obstructive:    Obesity:    Hypertension:    DVT:    Pulmonary embolism:    c and d hysterostomy:   Past, Family and Social History:   Past Medical History positive    Cardiovascular deep vein thrombosis; hypertension    Respiratory pulmonary embolism    Genitourinary kidney stones    Neurological/Psychiatric depression    Past Surgical History D&C in 2012    Past Medical History Comments Morbid  obesity, arthritis    Family History positive    Family History Comments Family history positive for hypertension and adult onset diabetes.    Social History noncontributory    Additional Past Medical and Surgical History Seen by herself today   Allergies:   No Known Allergies:   Home Meds:  Home Medications: Medication Instructions Status  acetaminophen-oxycodone 325 mg-5 mg oral tablet 1  orally every 6 hours x 30 days Active  lactulose 10 g/15 mL oral syrup 30 milliliter(s) orally once a day Active  Xanax 0.25 mg oral tablet 1  orally 2 times a day, As Needed- for Anxiety, Nervousness  Active  amlodipine 5 mg oral tablet 1 tab(s) orally once a day Active  metoprolol tartrate 25 mg oral tablet 1 tab(s) orally 2 times a day Active  ferrous sulfate 325 mg (65 mg elemental iron) oral tablet 1 tab(s) orally 2 times a day Active  boost 1  orally once a day Active  Coumadin 5 mg oral tablet 1.5 tab(s) every Sat and Sun  and 1 tab Mon-Fridays Active   Review of Systems:   General negative    Performance Status (ECOG) 1    Skin negative    Breast negative    Ophthalmologic negative    ENMT negative    Respiratory and Thorax see HPI    Cardiovascular see HPI    Gastrointestinal negative    Genitourinary see HPI    Musculoskeletal negative    Neurological negative    Psychiatric see HPI    Hematology/Lymphatics negative    Endocrine negative  Allergic/Immunologic negative   Nursing Notes:  Nursing Vital Signs and Chemo Nursing Nursing Notes: *CC Vital Signs Flowsheet:   07-May-13 11:31   Temp Temperature 98.5   Pulse Pulse 93   Respirations Respirations 20   SBP SBP 144   DBP DBP 86   Pain Scale (0-10)  0   Height (cm) centimeters 160   Physical Exam:  General/Skin/HEENT:   General normal    Skin normal    Eyes normal    ENMT normal    Head and Neck normal    Additional PE Well-developed obese female in NAD. Lungs are clear to A&P cardiac  examination shows regular rate and rhythm. Abdomen is obese with no organomegaly or masses noted. She has significant peripheral edema in her lower extremities bilaterally.   Breasts/Resp/CV/GI/GU:   Respiratory and Thorax normal    Cardiovascular normal    Gastrointestinal normal    Genitourinary normal   MS/Neuro/Psych/Lymph:   Musculoskeletal normal    Neurological normal    Lymphatics normal   Other Results:  Radiology Results: MRI:    02-May-13 13:43, MRI Pelvis WWO   MRI Pelvis WWO    REASON FOR EXAM:    Ovarian CA onset of post menopausal bleeding eval for   disease in pelvis  COMMENTS:       PROCEDURE: MR  - MR PELVIS WO/W CONTRAST  - Mar 19 2012  1:43PM     RESULT:     History: Ovarian cancer. Postmenopausal bleeding.    Comparison Study: Prior CT of 03/03/2012.    Findings: Multiplanar, multisequence imaging of the pelvis is obtained;   20 mL of MultiHance administered. Approximately 4 cm left ovary/left   ovarian mass is again noted. Similar finding is noted on recent CT.     Irregular and thickened endometrium is noted. Endometrial canal is   thickened up to 3.3 cm. These findings suggest endometrial carcinoma.   Possible invasion into the myometrium may be present. Small uterine   fibroid anteriorly may be present. No free pelvic fluid is noted. The   bladder is nondistended. No bowel distention. No prominent pelvic lymph   nodes definitely identified.    IMPRESSION:  1. Left ovarian mass.  2. Severe endometrial thickening as described above. Endometrial   carcinoma could present in this fashion.    Thank you for the opportunity to contribute to the care of your patient.         Verified By: Gwynn BurlyHOMAS E. REGISTER, M.D., MD  CT:    16-Apr-13 11:39, CT Chest, Abd, and Pelvis With Contrast   CT Chest, Abd, and Pelvis With Contrast    REASON FOR EXAM:    restaging Stage IV ovarian cancer  COMMENTS:       PROCEDURE: CT  - CT CHEST ABDOMEN AND PELVIS  W  - Mar 03 2012 11:39AM     RESULT:     TECHNIQUE: Helical 5 mm sections were obtained from the lung bases   through the pubic symphysis status post intravenous administration of 96   ml of Isovue-300 and oral contrast.    FINDINGS: Evaluation of the anterior lateral aspect of the medial segment   of the right middle lobe demonstrates a 1.5 cm, subpleural nodule. This   area is notincluded on the previous study. The previously described   density within the posterior base of the left lower lobe is significantly     decreased in conspicuity. There is trace residual density measuring 6.6  mm. Linear areas of increased density project within the lung bases   posteriorly and likely representing areas of fibrosis. A filling defect   is appreciated within the right lower lobe pulmonary artery. 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    The liver, spleen, adrenals, pancreas, and kidneys are unremarkable. An   IVC filter is identified. The previously described retroperitoneal   adenopathy has significantly decreased in size and conspicuity when   compared to the previous study. No new masses or nodules are identified.   Small, subcentimeter retroperitoneal lymph nodes are identified. The   largest lymph node, in a periaortic location and identified on image #75,   measures 1.98 cm in narrowest dimensions. There is no CT evidence of   bowel obstruction. A mass with central low attenuation projects within   the left adnexal region measuring 4.34 x 3.2 cm. This likely represents     the left ovary. There has been significant decreased size of a right   adnexal mass likely representing the right ovary and measures   approximately 2.52 x 3.64 cm.    IMPRESSION:      1.  Interval improvement of the patient's retroperitoneal adenopathy.   There has also been improvement in the patient's left lower lobe   pulmonary nodule. Residual decreased density projects in  the left lower   lobe. A right middle lobe nodule is identified.  2.  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   3.  Dr. Orlie Dakin of the Oncology Service was informed of these findings   via a preliminary faxed report.    Thank you for this opportunity to contribute to the care of your patient.           Verified By: Jani Files, M.D., MD  Nuclear Med:    16-Oct-12 15:19, PET/CT Scan Metabolic Evaluation Whole   PET/CT Scan Metabolic Evaluation Whole    REASON FOR EXAM:    multiple pulmonary nodules, abd/retroperitoneal LAD.  COMMENTS:   LMP: N/A    PROCEDURE: PET - PET/CT METABOLIC EVALUATION  - Sep 03 2011  3:19PM     RESULT:     History: Pulmonary nodules.    Comparison Study: Prior chest CT of 09/02/2011.     Findings:  Following determination of a fasting blood sugar of 132 mg/dL,   16.10 mCi of R-60 FDG was administered to the patient and PET/CT   obtained. Multiple PET positive pulmonary nodules are present. SUV levels   up to 10.6 noted.No other PET positive abnormalities are identified.     Positive retroperitoneal and bilateral iliac adenopathy is present. Renal   stone disease is noted.      IMPRESSION:      Multiple PET positive pulmonary nodules and PET positive retroperitoneal  and bilateral iliac adenopathy.    Thank you for the opportunity to contribute to the care of your patient.           Verified By: Gwynn Burly, M.D., MD   Assessment and Plan:  Impression:   widespread metastatic disease from probable endometrial primary with clear cell features and her retroperitoneal lymph nodes and PET avid lung metastasis in 64 year old female with good response to systemic chemotherapy.  Plan:   at this time I discussed the case with medical oncology and Dr. Hyacinth Meeker. Do not think the patient is curable from a radiation standpoint based on her widespread advanced metastatic disease.  I will offer  palliative radiation therapy to her whole pelvis treat up to 4500 cGy over 4 weeks and evaluate for response. We can control some of her bleeding or prevent tumor progression to her sacral plexus I believe we can achieve good palliation her case. Risks and benefits of treatment including possibility of diarrhea, dysuria, fatigue and possible see damage to her small bowel was all explained in detail to the patient. She seems to [place] treatment plan well. I have set her up for CT simulation the following week.  I would like to take this opportunity to thank you for allowing me to continue to participate in this patient's care.  Electronic Signatures: Rebeca Alert (MD)  (Signed 07-May-13 15:26)  Authored: HPI, Diagnosis, Past Hx, PFSH, Allergies, Home Meds, ROS, Nursing Notes, Physical Exam, Other Results, Encounter Assessment and Plan   Last Updated: 07-May-13 15:26 by Rebeca Alert (MD)
